# Patient Record
Sex: Female | Born: 1965 | Hispanic: No | Marital: Married | Smoking: Never smoker
Health system: Southern US, Community
[De-identification: ages and names within clinical notes are randomized; demographics above are authoritative.]

## PROBLEM LIST (undated history)

## (undated) DIAGNOSIS — I10 Essential (primary) hypertension: Secondary | ICD-10-CM

## (undated) DIAGNOSIS — E7800 Pure hypercholesterolemia, unspecified: Secondary | ICD-10-CM

## (undated) DIAGNOSIS — E119 Type 2 diabetes mellitus without complications: Secondary | ICD-10-CM

## (undated) DIAGNOSIS — D649 Anemia, unspecified: Secondary | ICD-10-CM

## (undated) HISTORY — DX: Anemia, unspecified: D64.9

---

## 2000-12-21 ENCOUNTER — Other Ambulatory Visit: Admission: RE | Admit: 2000-12-21 | Discharge: 2000-12-21 | Payer: Self-pay | Admitting: Obstetrics and Gynecology

## 2001-01-23 ENCOUNTER — Ambulatory Visit (HOSPITAL_COMMUNITY): Admission: RE | Admit: 2001-01-23 | Discharge: 2001-01-23 | Payer: Self-pay | Admitting: Obstetrics and Gynecology

## 2001-01-23 ENCOUNTER — Encounter: Payer: Self-pay | Admitting: Obstetrics and Gynecology

## 2001-04-01 ENCOUNTER — Inpatient Hospital Stay (HOSPITAL_COMMUNITY): Admission: AD | Admit: 2001-04-01 | Discharge: 2001-04-01 | Payer: Self-pay | Admitting: *Deleted

## 2001-04-01 ENCOUNTER — Encounter: Payer: Self-pay | Admitting: Obstetrics and Gynecology

## 2001-04-16 ENCOUNTER — Encounter (INDEPENDENT_AMBULATORY_CARE_PROVIDER_SITE_OTHER): Payer: Self-pay | Admitting: Specialist

## 2001-04-16 ENCOUNTER — Inpatient Hospital Stay (HOSPITAL_COMMUNITY): Admission: AD | Admit: 2001-04-16 | Discharge: 2001-04-20 | Payer: Self-pay | Admitting: Obstetrics and Gynecology

## 2004-06-23 ENCOUNTER — Emergency Department (HOSPITAL_COMMUNITY): Admission: EM | Admit: 2004-06-23 | Discharge: 2004-06-23 | Payer: Self-pay | Admitting: Emergency Medicine

## 2005-01-25 ENCOUNTER — Ambulatory Visit: Payer: Self-pay | Admitting: Family Medicine

## 2005-07-01 ENCOUNTER — Ambulatory Visit: Payer: Self-pay | Admitting: Family Medicine

## 2005-08-03 ENCOUNTER — Ambulatory Visit: Payer: Self-pay | Admitting: Family Medicine

## 2005-12-14 ENCOUNTER — Emergency Department (HOSPITAL_COMMUNITY): Admission: EM | Admit: 2005-12-14 | Discharge: 2005-12-14 | Payer: Self-pay | Admitting: Emergency Medicine

## 2006-05-24 ENCOUNTER — Other Ambulatory Visit: Admission: RE | Admit: 2006-05-24 | Discharge: 2006-05-24 | Payer: Self-pay | Admitting: Family Medicine

## 2007-02-14 ENCOUNTER — Inpatient Hospital Stay (HOSPITAL_COMMUNITY): Admission: AD | Admit: 2007-02-14 | Discharge: 2007-02-22 | Payer: Self-pay | Admitting: Obstetrics and Gynecology

## 2007-02-28 ENCOUNTER — Inpatient Hospital Stay (HOSPITAL_COMMUNITY): Admission: AD | Admit: 2007-02-28 | Discharge: 2007-04-11 | Payer: Self-pay | Admitting: Obstetrics and Gynecology

## 2007-04-05 ENCOUNTER — Encounter: Payer: Self-pay | Admitting: Obstetrics and Gynecology

## 2007-04-08 ENCOUNTER — Encounter (INDEPENDENT_AMBULATORY_CARE_PROVIDER_SITE_OTHER): Payer: Self-pay | Admitting: Obstetrics and Gynecology

## 2007-04-13 ENCOUNTER — Inpatient Hospital Stay (HOSPITAL_COMMUNITY): Admission: AD | Admit: 2007-04-13 | Discharge: 2007-04-15 | Payer: Self-pay | Admitting: Obstetrics and Gynecology

## 2007-09-12 ENCOUNTER — Emergency Department (HOSPITAL_COMMUNITY): Admission: EM | Admit: 2007-09-12 | Discharge: 2007-09-12 | Payer: Self-pay | Admitting: Emergency Medicine

## 2007-10-15 ENCOUNTER — Emergency Department (HOSPITAL_COMMUNITY): Admission: EM | Admit: 2007-10-15 | Discharge: 2007-10-15 | Payer: Self-pay | Admitting: Emergency Medicine

## 2007-12-26 ENCOUNTER — Emergency Department (HOSPITAL_COMMUNITY): Admission: EM | Admit: 2007-12-26 | Discharge: 2007-12-26 | Payer: Self-pay | Admitting: Emergency Medicine

## 2008-08-05 ENCOUNTER — Other Ambulatory Visit: Admission: RE | Admit: 2008-08-05 | Discharge: 2008-08-05 | Payer: Self-pay | Admitting: Family Medicine

## 2009-12-04 ENCOUNTER — Emergency Department (HOSPITAL_COMMUNITY)
Admission: EM | Admit: 2009-12-04 | Discharge: 2009-12-04 | Payer: Self-pay | Source: Home / Self Care | Admitting: Family Medicine

## 2010-04-29 ENCOUNTER — Other Ambulatory Visit (HOSPITAL_COMMUNITY): Payer: Self-pay | Admitting: Family Medicine

## 2010-04-29 DIAGNOSIS — Z1231 Encounter for screening mammogram for malignant neoplasm of breast: Secondary | ICD-10-CM

## 2010-05-07 ENCOUNTER — Ambulatory Visit (HOSPITAL_COMMUNITY)
Admission: RE | Admit: 2010-05-07 | Discharge: 2010-05-07 | Disposition: A | Discharge status: Home / Self Care | Payer: BC Managed Care – PPO | Source: Ambulatory Visit | Attending: Family Medicine | Admitting: Family Medicine

## 2010-05-07 DIAGNOSIS — Z1231 Encounter for screening mammogram for malignant neoplasm of breast: Secondary | ICD-10-CM | POA: Insufficient documentation

## 2010-05-19 NOTE — Op Note (Signed)
NAME:  [name] [name], [name] [name]                    ACCOUNT NO.:  [id number]   MEDICAL RECORD NO.:  [id number]          PATIENT TYPE:  INP   LOCATION:                                [facility]   PHYSICIAN:  Dineen Kid. Rana Snare, M.D.    DATE OF BIRTH:  1965-11-16   DATE OF PROCEDURE:  04/08/2007  DATE OF DISCHARGE:                               OPERATIVE REPORT   PREOPERATIVE DIAGNOSIS:  Intrauterine pregnancy at 11 and 4/7 weeks with  twins, now with labor, previous cesarean section with desired repeat,  multiparity and desires sterility.   POSTOPERATIVE DIAGNOSIS:  Intrauterine pregnancy at 31 and 4/7 weeks  with twins, now with labor, previous cesarean section with desired  repeat, multiparity and desires sterility plus extensive pelvic  adhesions.   SURGEON:  Dineen Kid. Rana Snare, M.D.   ANESTHESIA:  Spinal.   PROCEDURE:  Repeat low segment transverse cesarean section, [facility]  bilateral tubal ligation, lysis of adhesions and then left salpingo-  oophorectomy.   INDICATIONS:  Ms. [name] [name] is a 45 year old G5, P4, who has been admitted to  the hospital for over a month with preterm labor with advanced cervical  dilation over the last week and previous cesarean section desiring  repeat cesarean section, multiparity.  This afternoon she began laboring  despite tocolysis with burst terbutaline and was contracting every 3-5  minutes with cervix 5 cm dilated and 100% effaced.  We proceeded with  repeat cesarean section.  She did have multiparity and desires sterility  and planned tubal ligation.  Risks and benefits of the procedure were  discussed at length which included but not limited to risk of infection,  bleeding, damage to the uterus, tubes, ovaries, bowel or bladder,  fetus/fetuses, tubal failure rate at 05/998.  She gives her informed  consent and wished to proceed.   FINDINGS AT SURGERY:  Viable female infant, Apgars 8 and 8, pH arterial  7.36; viable female infant, Apgars 8 and 9, pH 3.6.  Baby A  was vertex.  Baby B was breech.  Extensive adhesions from the anterior abdominal wall  to the uterus from the fundus down to the lower segment and also at the  left adnexa.   DESCRIPTION OF PROCEDURE:  After adequate analgesia the patient was  placed in the supine position with left lateral tilt.  She was sterilely  prepped and draped.  The bladder was sterilely drained with Foley  catheter.  A Pfannenstiel skin incision was made two fingerbreadths  above the pubic symphysis and taken down sharply to the fascia which was  incised transversely and extended superiorly and inferiorly.  The  bellies of the rectus muscles were separated sharply midline.  Peritoneum was entered sharply.  The adhesions were carefully dissected  off the anterior surface and bladder flap was created and placed behind  the bladder blade.  Low segment myotomy incision was made down the  infant's vertex and vertex of A was delivered atraumatically and nares  and pharynx suctioned.  Baby boy was delivered and handed to the  pediatricians for resuscitation.  Cord blood was obtained.  Amniotomy  was performed on baby B.  The feet were grasped and double footling  breech was delivered viable female.  The nares and pharynx were  suctioned.  Cord clamped, cut and handed to pediatrician for  resuscitation.  Cord blood was obtained.  Placenta extracted manually.  The uterus was wiped clean with a dry lap.  The myotomy incision was  closed in two layers, first being a running locking and second being an  imbricating layer of zero Monocryl suture.  Unable to identify the  fallopian tubes due to the extensive scar tissue.  Meticulous dissection  of the uterus from the anterior abdominal wall was carried out.  The  uterus was then exteriorized and scar tissue was also dissected from the  left adnexal area.  Right fallopian tube was identified by the  fimbriated end.  Midportion of fallopian tube was grasped.  Two sutures  of  zero plain were passed to the mesosalpinx which was doubly ligated,  central portion excised and 2-0 silk was used to ligate the proximal  portion.  Left fallopian tube was identified in a similar manner but  because of extensive adhesions an attempt at tubal ligation was  unsuccessful and because the adhesions and now bleeding from the area  from lysis of adhesions at the infundibulopelvic ligament a Kelly clamp  was placed across the IFP and the mesosalpinx with good control of  hemostasis.  The sutures of zero Monocryl were placed across the  infundibulopelvic ligament and also across the mesosalpinx.  The left  distal fallopian tube and also left ovary were removed.  The left ovary  again was removed because of, to achieve hemostasis where lysis of  adhesions and bleeding occurred along.  The uterus was then placed back  into the peritoneal cavity and after a copious amount of irrigation  adequate hemostasis was assured.  The peritoneum was closed with zero  Monocryl.  Rectus muscle plicated in midline.  Irrigation was applied  after adequate hemostasis.  The fascia was closed with zero PDS in  running fashion.  Irrigation applied after adequate hemostasis.  Skin  staples and Steri-Strips applied.  The patient tolerated the procedure  well and was stable on transfer to the recovery room.  Sponge,  instrument count was normal x3.  Estimated blood loss 1000 mL.  I did  give Methergine 0.2 mg intramuscularly at the time of the delivery due  to uterine atony.  She also had some atony after the delivery and  required Methergine.      Dineen Kid Rana Snare, M.D.  Electronically Signed     DCL/MEDQ  D:  04/08/2007  T:  04/08/2007  Job:  [id number]

## 2010-05-19 NOTE — H&P (Signed)
NAME:  [name] [name], [name] [name]                    ACCOUNT NO.:  [id number]   MEDICAL RECORD NO.:  [id number]          PATIENT TYPE:  INP   LOCATION:  9151                          [facility]ILITY:  [facility]   PHYSICIAN:  Dineen Kid. Rana Snare, M.D.    DATE OF BIRTH:  06-Nov-1965   DATE OF ADMISSION:  02/28/2007  DATE OF DISCHARGE:                              HISTORY & PHYSICAL   HISTORY OF PRESENT ILLNESS:  Ms. Kenzleigh is a 45 year old, G5, P4 with twin  pregnancy, history of preterm labor, and advanced cervical effacement  who was hospitalized until last week due to this and was discharged  home.  She presented today to the office today for routine follow-up.  Ultrasound evaluation shows Baby A in the vertex position, 78th  percentile for fetal weight, with normal fluid and baby B in transverse  presentation 75th percentile for weight with normal fluid.  However,  cervical length of 6.9 mm with funneling.  Her previous ultrasound was 9  mm with funneling.  The patient denied any contractions and had been on  bed rest.  However, no tocolytics since her discharge home last week.  On placing her on fetal monitoring, she was contracting every 3-5  minutes, was given 2.5 mg orally with minimal response and so was  admitted to the hospital for magnesium sulfate tocolysis due to preterm  labor.  Her estimated date of confinement is May 23, 2007.   Her past medical history is significant for previous cesarean section,  last pregnancy at 24 weeks, three vaginal deliveries.   Her medications are prenatal vitamins.   She has no known drug allergies.   PHYSICAL EXAMINATION:  Blood pressure is 120/72.  HEART:  Regular rate and rhythm.  LUNGS:  Clear to auscultation bilaterally.  ABDOMEN:  Is gravid, nontender.  Cervical length by ultrasound is 6.9 mm with funneling.   IMPRESSION AND PLAN:  Twin pregnancy at [redacted] weeks gestational age, vertex  transverse presentation with planned cesarean section for delivery with  cervical  change on ultrasound and also contractions.  Plan to readmit  for magnesium sulfate tocolysis.  Patient is status post betamethasone.      Dineen Kid Rana Snare, M.D.  Electronically Signed     DCL/MEDQ  D:  03/01/2007  T:  03/01/2007  Job:  [id number]

## 2010-05-19 NOTE — H&P (Signed)
NAME:  [name], [name]                    ACCOUNT NO.:  [id number]   MEDICAL RECORD NO.:  [id number]          PATIENT TYPE:  MAT   LOCATION:  DFTL                          [facility]   PHYSICIAN:  Duke Salvia. Marcelle Overlie, M.D.DATE OF BIRTH:  [birth year]   DATE OF ADMISSION:  DATE OF DISCHARGE:                              HISTORY & PHYSICAL   CHIEF COMPLAINT:  Increased contractions.   HISTORY OF PRESENT ILLNESS:  A 45 year old, G5, P3-1-0-4, EDD May 19,  twin gestation 25-6/7 weeks. Of significance, this patient had a  cesarean section for preterm labor with bleeding in 2003 delivering a 3  pound 8 ounce female at 24 weeks. She is pregnant with twins, presented  for routine ultrasound today for growth and the cervical length was  noted to be 9 mm with funneling. On questioning, she is complaining of  increased pelvic pressure.   PHYSICAL EXAMINATION:  There is bulging of the lower uterine segment.  She is dilated about a fingertip. She is admitted now for betamethasone,  tocolytics and further observation.   She is O+.   OBSTETRICAL HISTORY:  Three vaginal deliveries at term, one cesarean  section as noted above.   ALLERGIES:  None.   For family and social history, please see the [facility] form.   PHYSICAL EXAMINATION:  VITAL SIGNS:  Temperature 98.2, blood pressure  124/80.  HEENT:  Unremarkable.  NECK:  Supple without masses.  LUNGS:  Clear.  CARDIOVASCULAR:  Regular rate and rhythm without murmurs, rubs or  gallops.  BREASTS:  Not examined.  30 cm fundal height. Fetal heart rate was 140 A and B. Cervix was  fingertip, thin, bulging lower uterine segment, membranes intact.  EXTREMITIES:  Unremarkable.  NEUROLOGIC:  Unremarkable.   IMPRESSION:  1. 25-6/7 week twin gestation.  2. Preterm labor.  3. History of prior cesarean section for preterm delivery.   PLAN:  Will admit for tocolytics, bedrest, betamethasone.      Richard M. Marcelle Overlie, M.D.  Electronically  Signed     RMH/MEDQ  D:  02/14/2007  T:  02/14/2007  Job:  [id number]

## 2010-05-19 NOTE — Discharge Summary (Signed)
NAME:  [name], [name]                    ACCOUNT NO.:  [id number]   MEDICAL RECORD NO.:  [id number]          PATIENT TYPE:  INP   LOCATION:  9309                          [facility]   PHYSICIAN:  Juluis Mire, M.D.   DATE OF BIRTH:  Aug 08, 1965   DATE OF ADMISSION:  04/13/2007  DATE OF DISCHARGE:  04/15/2007                               DISCHARGE SUMMARY   ADMISSION DIAGNOSIS:  Postpartum endometritis.   DISCHARGE DIAGNOSIS:  Postpartum endometritis.   For complete history and physical, please see dictated note.   HOSPITAL COURSE:  The patient was brought and begun on antibiotics in  the form of gentamicin, ampicillin, and Cleocin.  She had immediate  defervescence of her temperature.  She remained completely afebrile  throughout Friday, 10th and until Saturday, 11th.  After discussion,  decision was to send the patient home at that time.  At the time of  discharge, she was afebrile.  Abdomen was soft.  She had minimal uterine  tenderness at this point.  Incision was clear.  Bowel sounds were  active.  She was tolerating her diet and voiding without difficulty.  It  is noted that her white count initially was 15,100.  She continues to  have anemia of hemoglobin 6.9, is on iron at home.   In terms of complication, I count stay in hospital chart.  The patient  is discharged home in stable condition.   DISPOSITION:  She is discharged home on Augmentin.  She has pain  medicine and iron at home.  She was instructed to call with increasing  fever, nausea, vomiting, or active vaginal bleeding.  She will follow up  in the office early next week.      Juluis Mire, M.D.  Electronically Signed     JSM/MEDQ  D:  04/15/2007  T:  04/15/2007  Job:  [id number]   cc:   Zelphia Cairo, MD  Fax: [phone]

## 2010-05-22 NOTE — Op Note (Signed)
[facility]  Patient:    [name] [name], [name] [name] Visit Number: [id number] MRN: [id number]          Service Type: OBS Location: [facility] Attending Physician:  Malon Kindle Dictated by:   Zenaida Niece, M.D. Proc. Date: 04/16/01 Admit Date:  04/01/2001 Discharge Date: 04/01/2001                             Operative Report  PREOPERATIVE DIAGNOSES:       1. Intrauterine pregnancy at 30 weeks.                               2. Advanced preterm labor.                               3. Breech presentation.  POSTOPERATIVE DIAGNOSES:      1. Intrauterine pregnancy at 30 weeks.                               2. Advanced preterm labor.                               3. Breech presentation.  OPERATION:                    Primary low transverse cesarean section without                               extensions.  SURGEON:                      Zenaida Niece, M.D.  ANESTHESIA:                   Spinal.  ESTIMATED BLOOD LOSS:         800 cc.  FINDINGS:                     The patient had a normal female anatomy and delivered a viable female infant with Apgars of 9 and 9 that weighed 1898 g and was taken to the NICU.  DESCRIPTION OF PROCEDURE:     The patient was taken to the operating room and placed in the dorsal supine position and then rolled to her side.  Dr. Pamalee Leyden instilled spinal anesthesia and she was placed in the dorsal supine position with a left lateral tilt.  Her abdomen was prepped and draped in the usual sterile fashion and a Foley catheter inserted.  The level of her anesthesia was found to be adequate and her abdomen was entered via standard Pfannenstiel incision.  The bladder flap was created sharply and a 4 cm transverse incision made from the lower uterine segment.  The incision was extended bilaterally and digitally and with bandaged scissors.  Membranes were ruptured with an Allis clamp and revealed clear fluid.  The baby was found to be  in a frank breech presentation.  The baby delivered atraumatically.  The mouth and nares were suctioned, the cord was doubly clamped and cut, and then the infant handed to the waiting pediatric team.  Cord blood and segment of cord for gas were obtained  and the placenta delivered spontaneously.  The uterus was wiped with a clean lap pad and found to be normal.  The uterine incision was inspected and found to be free of any extensions.  It was then closed in one layer, being a running locking layer with #1 chromic with adequate hemostasis. Bleeding from the serosal edges was controlled with electrocautery.  Both tubes and ovaries were inspected and found to be normal.  The subfascial space was irrigated and made hemostatic with electrocautery.  The deep fascia was closed in a running fashion starting at both ends and meeting in the middle with 0 Vicryl.  The subcutaneous tissue was irrigated and made hemostatic with electrocautery and the skin was closed with staples and a sterile dressing.  The patient tolerated the procedure well and was taken to the recovery room in stable condition. Dictated by:   Zenaida Niece, M.D. Attending Physician:  Malon Kindle DD:  04/16/01 TD:  04/17/01 Job: [id number]

## 2010-05-22 NOTE — Discharge Summary (Signed)
[facility]  Patient:    [name] [name], [name] [name] Visit Number: [id number] MRN: [id number]          Service Type: OBS Location: 9300 [id number] Attending Physician:  Michaele Offer Dictated by:   Zenaida Niece, M.D. Admit Date:  04/16/2001 Discharge Date: 04/20/2001                             Discharge Summary  ADMISSION DIAGNOSES:          1. Intrauterine pregnancy at 30 weeks.                               2. Preterm labor.                               3. Breech presentation.  DISCHARGE DIAGNOSES:          1. Intrauterine pregnancy at 30 weeks.                               2. Preterm labor.                               3. Breech presentation.  PROCEDURE:                    Primary low transverse cesarean section.  HISTORY OF PRESENT ILLNESS:   This is a 45 year old [place] female, gravida 4, para 3-0-0-3, with an EGA of [redacted] weeks by an 11-week ultrasound with a due date of June 20, who presents with complaint of regular contractions, bloody show, good fetal movement, and no rupture of membranes.  Evaluation in maternity admissions unit revealed her cervix to be 4 cm dilated with regular contractions and she was admitted to attempt tocolysis.  Prenatal care complicated by advanced maternal age for which she declined an amniocentesis and had a normal triple screen and normal ultrasound.  She had a second trimester UTI with coagulase negative Staph treated with Macrobid with a negative test of cure.  Cervix was long and closed at 24 weeks, recent size greater than dates with her last visit on March 20.  PRENATAL LABORATORY DATA:     Blood type is O positive with a negative antibody screen, RPR nonreactive, rubella immune, hepatitis B surface antigen negative, HIV negative, Gonorrhea and Chlamydia negative, triple screen normal.  PAST OBSTETRIC HISTORY:       1989 vaginal delivery at 40 weeks, 9 pounds. 1990 vaginal delivery at 40 weeks, 8  pounds.  1996 vaginal delivery at 40 weeks, 6 pounds.  These were all fast labors.  PHYSICAL EXAMINATION:         VITAL SIGNS: She was afebrile with stable vital signs.  Fetal heart rate tracing reactive with contractions every two to three minutes.  ABDOMEN: Benign, gravid, and nontender.  PELVIC: On my first examination she was 5, 70, -3 with a questionable presentation and she changed to 6, 70, -2 and she was breech by ultrasound.  HOSPITAL COURSE:              The patient was admitted and had magnesium started in an attempt at tocolysis.  However, she continued to progress and breech presentation was  confirmed.  The situation was discussed with the patient and her husband and we agreed to proceed with cesarean section.  On the morning of April 13, she underwent a primary low transverse cesarean section under spinal anesthesia.  Estimated blood loss was 800 cc.  The patient had normal anatomy and delivered a viable female infant with Apgars of 9 and 9 that weighed 1898 grams.  Postoperatively, she did very well, was rapidly able to ambulate, and tolerate a regular diet.  She remained afebrile. Predelivery hemoglobin 13.1, postdelivery was 10.2.  On the morning of postoperative day #4, she was felt to be stable enough for discharge home. Her baby was stable in the NICU.  Incision was healing well, staples were removed, and Steri-Strips applied.  DISCHARGE INSTRUCTIONS:       Diet is a regular diet.  Activity is pelvic rest. No strenuous activity and no driving.  FOLLOW-UP:                    In two weeks.  DISCHARGE MEDICATIONS:        Percocet p.r.n. pain.  She is given our discharge pamphlet. Dictated by:   Zenaida Niece, M.D. Attending Physician:  Michaele Offer DD:  04/20/01 TD:  04/20/01 Job: [id number]

## 2010-05-22 NOTE — Discharge Summary (Signed)
NAME:  [name] [name], [name] [name]                    ACCOUNT NO.:  [id number]   MEDICAL RECORD NO.:  [id number]          PATIENT TYPE:  INP   LOCATION:  9151                          [facility]ILITY:  [facility]   PHYSICIAN:  Duke Salvia. Marcelle Overlie, M.D.DATE OF BIRTH:  October 27, 1965   DATE OF ADMISSION:  02/14/2007  DATE OF DISCHARGE:  02/22/2007                               DISCHARGE SUMMARY   ADMITTING DIAGNOSES:  1. Intrauterine pregnancy at 25-6/7th weeks' estimated gestational      age.  2. Twin gestation.  3. Preterm labor.   DISCHARGE DIAGNOSES:  1. Intrauterine pregnancy at 44 weeks estimated gestational age.  2. Twin gestation.  3. Preterm labor arrested.   REASON FOR ADMISSION:  Please see dictated H&P.   HOSPITAL COURSE:  The patient is a 45 year old [place] gravida 5, para  3-1-0-4, who was admitted 25-6/7th weeks with twin gestation after  routine ultrasound revealed cervical length of 9 mm with funneling.  The  patient's history was significant for preterm delivery by cesarean in  2003 at 24 weeks.  The patient was now admitted for betamethasone  tocolytics in further observation.  Fetal heart tones were reactive.  Fundal height was noted to be 30 cm.  Cervical length was fingertip  dilated with bulge in lower uterine segment.  Membranes were intact.  Twins were noted to be in the vertex breech presentation.  The patient  was started on magnesium sulfate and steroids were given.  The patient  was also receiving progesterone weekly injections.  Routine Glucola  screening was noted to be elevated.  3-hour glucose tolerance test was  performed which revealed one abnormal value.  Repeat ultrasound revealed  cervical length of 1 cm with increase in amniotic fluid volume and  concordant growth.  Consult was made with maternal fetal medicine.  The  patient did desire discharge and due to no further contractions and  cervical length being stable, arrangements were made.   CONDITION ON DISCHARGE:   Stable.   DIET:  Modified carbohydrate, gestational diabetes diet.   ACTIVITY:  Bed rest with bathroom privileges.  The patient was  instructed to call for increasing distal cramping, uterine contractions,  or low backache.  The patient was also encouraged to call for vaginal  bleeding, gush of fluid, or increasing pressure.   DISCHARGE MEDICATIONS:  Prenatal vitamins and weekly progesterone  injections.      Julio Sicks, N.P.      Richard M. Marcelle Overlie, M.D.  Electronically Signed    CC/MEDQ  D:  05/21/2007  T:  05/22/2007  Job:  [id number]

## 2010-05-22 NOTE — Discharge Summary (Signed)
NAME:  [name] [name], [name] [name]                    ACCOUNT NO.:  [id number]   MEDICAL RECORD NO.:  [id number]           PATIENT TYPE:   LOCATION:                                 FACILITY:   PHYSICIAN:  Guy Sandifer. Henderson Cloud, M.D. DATE OF BIRTH:  03/18/1965   DATE OF ADMISSION:  04/08/2007  DATE OF DISCHARGE:  04/11/2007                               DISCHARGE SUMMARY   ADMITTING DIAGNOSES:  1. Intrauterine pregnancy at 33-4/7th weeks' estimated gestational      age.  2. Twin gestation.  3. Spontaneous onset of labor.  4. Previous cesarean sections.  5. Multiparity, desires permanent sterilization.   DISCHARGE DIAGNOSES:  1. Status post low-transverse cesarean section.  2. Viable female and female infants.  3. Permanent sterilization.   PROCEDURE:  1. Repeat low-transverse cesarean section.  2. [place] bilateral tubal ligation.  3. Lysis of adhesions.  4. Left salpingo-oophorectomy.   REASON FOR ADMISSION:  Please see dictated H and P.   HOSPITAL COURSE:  The patient is a 45 year old [place] gravida 5, para  4 that was admitted to [facility] with spontaneous onset  of labor.  The patient had been hospitalized intermittently over the  last month for preterm labor and advanced cervical dilatation.  The  patient had had a previous cesarean section and desired repeat.  On the  day of admission, the patient began laboring despite tocolysis with  terbutaline and was noted to be contracting every 3-5 minutes with  cervix now dilated to 5 cm and 100% effaced.  The patient was now taken  to the operating room where spinal anesthesia was administered without  difficulty.  A low-transverse incision was made with delivery of a  viable twin A baby female with Apgars of 8 at one minute and 8 at five  minute, arterial cord pH of 7.36; baby B was female infant with Apgars  of 8 at one minute and 9 at five minutes, and arterial cord pH of 7.36.  Extensive lysis of adhesions on the anterior  abdominal wall was noted  and was removed.  The patient tolerated the procedure well and was taken  to the recovery room in stable condition.  On postoperative day #1, the  patient was without complaint.  Vital signs were stable.  Abdomen soft.  Abdominal dressing was noted to be clean, dry, and intact.  Laboratory  findings revealed hemoglobin of 7.5.  Both babies were doing well on the  NICU.  On postoperative day #2, the patient was without complaint.  Vital signs were stable.  Abdomen soft.  Fundus firm and nontender.  Abdominal dressing was noted to be clean, dry and intact.  Laboratory  findings revealed hemoglobin of 6.3 and WBC count of 19.8.  On  postoperative day #3, the patient denied dizziness or short of breath.  Babies were stable in the NICU.  Vital signs were stable.  She was  afebrile.  Heart rate was 85-100.  Abdomen soft.  Fundus firm and  nontender.  Incision was clean, dry, and intact.  Staples were removed.  Laboratory  findings revealed hemoglobin 6.2 and WBC count of 19.3.   DISCHARGE INSTRUCTIONS:  Reviewed and the patient was later discharged  home.   CONDITION ON DISCHARGE:  Stable.   DIET:  Regular as tolerated.   ACTIVITY:  No heavy lifting, no driving x2 weeks, and no vaginal entry.   FOLLOWUP:  The patient to follow up in the office in 1 week for an  incision check and hemoglobin check.  She is to call for temperature  greater than 100 degrees, persistent nausea, vomiting, heavy vaginal  bleeding, and/or redness or drainage at the incisional site.   DISCHARGE MEDICATIONS:  1. Percocet 5/325 mg, #30 one p.o. for 4-6 hours p.r.n.  2. Motrin 600 mg every 6 hours.  3. Tandem F one p.o. daily.  4. Prenatal vitamins one p.o. daily.      Julio Sicks, N.P.      Guy Sandifer. Henderson Cloud, M.D.  Electronically Signed    CC/MEDQ  D:  05/21/2007  T:  05/22/2007  Job:  [id number]

## 2010-06-02 ENCOUNTER — Other Ambulatory Visit (HOSPITAL_COMMUNITY)
Admission: RE | Admit: 2010-06-02 | Discharge: 2010-06-02 | Disposition: A | Discharge status: Home / Self Care | Payer: BC Managed Care – PPO | Source: Ambulatory Visit | Attending: Family Medicine | Admitting: Family Medicine

## 2010-06-02 ENCOUNTER — Other Ambulatory Visit: Payer: Self-pay | Admitting: Family Medicine

## 2010-06-02 DIAGNOSIS — Z0000 Encounter for general adult medical examination without abnormal findings: Secondary | ICD-10-CM | POA: Insufficient documentation

## 2010-09-25 LAB — CBC
HCT: 34.5 — ABNORMAL LOW
Hemoglobin: 11.9 — ABNORMAL LOW
Hemoglobin: 11.7 — ABNORMAL LOW
MCHC: 34.5
MCHC: 34.3
RBC: 4.07
RDW: 14.2

## 2010-09-25 LAB — GLUCOSE, 2 HOUR GESTATIONAL: Glucose Tolerance, 2 hour: 197 — ABNORMAL HIGH

## 2010-09-28 LAB — CBC
HCT: 31 — ABNORMAL LOW
MCV: 82.8
Platelets: 333
RBC: 3.74 — ABNORMAL LOW
WBC: 11.6 — ABNORMAL HIGH

## 2010-09-29 LAB — CBC
HCT: 19.3 — ABNORMAL LOW
HCT: 19.8 — ABNORMAL LOW
HCT: 32.1 — ABNORMAL LOW
Hemoglobin: 6.2 — CL
Hemoglobin: 6.3 — CL
Hemoglobin: 6.6 — CL
Hemoglobin: 6.9 — CL
MCHC: 34.1
MCV: 81.7
MCV: 81.9
MCV: 83.7
Platelets: 231
Platelets: 291
Platelets: 405 — ABNORMAL HIGH
RBC: 2.18 — ABNORMAL LOW
RBC: 2.23 — ABNORMAL LOW
RBC: 2.35 — ABNORMAL LOW
RDW: 17.3 — ABNORMAL HIGH
RDW: 17.4 — ABNORMAL HIGH
RDW: 18.2 — ABNORMAL HIGH
RDW: 18.7 — ABNORMAL HIGH
WBC: 15.1 — ABNORMAL HIGH
WBC: 19.8 — ABNORMAL HIGH

## 2010-09-29 LAB — URINALYSIS, ROUTINE W REFLEX MICROSCOPIC
Bilirubin Urine: NEGATIVE
Ketones, ur: 15 — AB
Nitrite: NEGATIVE
pH: 7

## 2010-09-29 LAB — URINE MICROSCOPIC-ADD ON

## 2010-09-29 LAB — DIFFERENTIAL
Lymphocytes Relative: 10 — ABNORMAL LOW
Myelocytes: 0
Neutrophils Relative %: 85 — ABNORMAL HIGH
Promyelocytes Absolute: 0

## 2010-09-29 LAB — CREATININE, SERUM
Creatinine, Ser: 0.89
GFR calc non Af Amer: >60

## 2010-10-05 LAB — DIFFERENTIAL
Basophils Absolute: 0
Basophils Relative: 0
Eosinophils Relative: 1
Lymphocytes Relative: 10 — ABNORMAL LOW
Monocytes Absolute: 0.5
Monocytes Relative: 7
Neutro Abs: 6.3

## 2010-10-05 LAB — CBC
Platelets: 341
RDW: 19.2 — ABNORMAL HIGH
WBC: 7.8

## 2010-10-05 LAB — URINALYSIS, ROUTINE W REFLEX MICROSCOPIC
Nitrite: NEGATIVE
Specific Gravity, Urine: 1.022
Urobilinogen, UA: 0.2
pH: 8

## 2010-10-05 LAB — COMPREHENSIVE METABOLIC PANEL
AST: 18
Albumin: 3.3 — ABNORMAL LOW
Alkaline Phosphatase: 71
BUN: 9
Chloride: 105
GFR calc Af Amer: >60
Potassium: 3.6
Total Bilirubin: 0.5
Total Protein: 6.8

## 2010-10-05 LAB — OCCULT BLOOD X 1 CARD TO LAB, STOOL: Fecal Occult Bld: NEGATIVE

## 2010-10-05 LAB — PREGNANCY, URINE: Preg Test, Ur: NEGATIVE

## 2010-10-07 LAB — POCT RAPID STREP A: Streptococcus, Group A Screen (Direct): NEGATIVE

## 2010-10-09 LAB — DIFFERENTIAL
Basophils Absolute: 0.1 10*3/uL (ref 0.0–0.1)
Basophils Relative: 1 % (ref 0–1)
Eosinophils Absolute: 0.1 10*3/uL (ref 0.0–0.7)
Monocytes Relative: 4 % (ref 3–12)
Neutrophils Relative %: 78 % — ABNORMAL HIGH (ref 43–77)

## 2010-10-09 LAB — CBC
HCT: 39.1 % (ref 36.0–46.0)
Hemoglobin: 12.6 g/dL (ref 12.0–15.0)
RBC: 5.36 MIL/uL — ABNORMAL HIGH (ref 3.87–5.11)
WBC: 10.2 10*3/uL (ref 4.0–10.5)

## 2010-10-09 LAB — COMPREHENSIVE METABOLIC PANEL
ALT: 20 U/L (ref 0–35)
Alkaline Phosphatase: 72 U/L (ref 39–117)
CO2: 23 mEq/L (ref 19–32)
Chloride: 105 mEq/L (ref 96–112)
Glucose, Bld: 99 mg/dL (ref 70–99)
Potassium: 4.2 mEq/L (ref 3.5–5.1)
Sodium: 135 mEq/L (ref 135–145)
Total Bilirubin: 0.4 mg/dL (ref 0.3–1.2)
Total Protein: 7.5 g/dL (ref 6.0–8.3)

## 2010-10-09 LAB — POCT PREGNANCY, URINE: Preg Test, Ur: NEGATIVE

## 2010-10-09 LAB — LACTIC ACID, PLASMA: Lactic Acid, Venous: 1.5 mmol/L (ref 0.5–2.2)

## 2011-01-19 ENCOUNTER — Other Ambulatory Visit: Payer: Self-pay | Admitting: Family Medicine

## 2011-01-19 DIAGNOSIS — M25511 Pain in right shoulder: Secondary | ICD-10-CM

## 2011-01-22 ENCOUNTER — Inpatient Hospital Stay: Admission: RE | Admit: 2011-01-22 | Payer: BC Managed Care – PPO | Source: Ambulatory Visit

## 2011-01-23 ENCOUNTER — Ambulatory Visit
Admission: RE | Admit: 2011-01-23 | Discharge: 2011-01-23 | Disposition: A | Discharge status: Home / Self Care | Payer: BC Managed Care – PPO | Source: Ambulatory Visit | Attending: Family Medicine | Admitting: Family Medicine

## 2011-01-23 DIAGNOSIS — M25511 Pain in right shoulder: Secondary | ICD-10-CM

## 2011-09-09 ENCOUNTER — Emergency Department (HOSPITAL_COMMUNITY): Payer: BC Managed Care – PPO

## 2011-09-09 ENCOUNTER — Encounter (HOSPITAL_COMMUNITY): Payer: Self-pay | Admitting: Emergency Medicine

## 2011-09-09 ENCOUNTER — Emergency Department (HOSPITAL_COMMUNITY)
Admission: EM | Admit: 2011-09-09 | Discharge: 2011-09-09 | Disposition: A | Discharge status: Home / Self Care | Payer: BC Managed Care – PPO | Attending: Emergency Medicine | Admitting: Emergency Medicine

## 2011-09-09 DIAGNOSIS — N200 Calculus of kidney: Secondary | ICD-10-CM | POA: Insufficient documentation

## 2011-09-09 DIAGNOSIS — R109 Unspecified abdominal pain: Secondary | ICD-10-CM | POA: Insufficient documentation

## 2011-09-09 DIAGNOSIS — R110 Nausea: Secondary | ICD-10-CM | POA: Insufficient documentation

## 2011-09-09 DIAGNOSIS — R300 Dysuria: Secondary | ICD-10-CM | POA: Insufficient documentation

## 2011-09-09 LAB — BASIC METABOLIC PANEL
BUN: 17 mg/dL (ref 6–23)
Creatinine, Ser: 1.14 mg/dL — ABNORMAL HIGH (ref 0.50–1.10)
GFR calc Af Amer: 66 mL/min — ABNORMAL LOW (ref >90)
GFR calc non Af Amer: 57 mL/min — ABNORMAL LOW (ref >90)

## 2011-09-09 LAB — URINALYSIS, ROUTINE W REFLEX MICROSCOPIC
Bilirubin Urine: NEGATIVE
Glucose, UA: NEGATIVE mg/dL
Ketones, ur: NEGATIVE mg/dL
Nitrite: NEGATIVE
pH: 5.5 (ref 5.0–8.0)

## 2011-09-09 LAB — CBC WITH DIFFERENTIAL/PLATELET
Basophils Relative: 1 % (ref 0–1)
Eosinophils Absolute: 0.3 10*3/uL (ref 0.0–0.7)
HCT: 37.4 % (ref 36.0–46.0)
Hemoglobin: 12.4 g/dL (ref 12.0–15.0)
MCH: 25.9 pg — ABNORMAL LOW (ref 26.0–34.0)
MCHC: 33.2 g/dL (ref 30.0–36.0)
Monocytes Absolute: 0.6 10*3/uL (ref 0.1–1.0)
Monocytes Relative: 7 % (ref 3–12)

## 2011-09-09 MED ORDER — ONDANSETRON HCL 4 MG/2ML IJ SOLN
4.0000 mg | INTRAMUSCULAR | Status: DC | PRN
  Administered 2011-09-09: 4 mg via INTRAVENOUS
  Filled 2011-09-09: qty 2

## 2011-09-09 MED ORDER — OXYCODONE-ACETAMINOPHEN 5-325 MG PO TABS: 1.0000 | ORAL_TABLET | Freq: Four times a day (QID) | ORAL | Status: AC | PRN

## 2011-09-09 MED ORDER — MORPHINE SULFATE 4 MG/ML IJ SOLN
4.0000 mg | Freq: Once | INTRAMUSCULAR | Status: AC
  Administered 2011-09-09: 4 mg via INTRAVENOUS
  Filled 2011-09-09: qty 1

## 2011-09-09 MED ORDER — KETOROLAC TROMETHAMINE 60 MG/2ML IM SOLN: 60.0000 mg | Freq: Once | INTRAMUSCULAR | Status: DC

## 2011-09-09 MED ORDER — OXYCODONE-ACETAMINOPHEN 5-325 MG PO TABS: 2.0000 | ORAL_TABLET | Freq: Once | ORAL | Status: DC

## 2011-09-09 MED ORDER — KETOROLAC TROMETHAMINE 30 MG/ML IJ SOLN
30.0000 mg | Freq: Once | INTRAMUSCULAR | Status: AC
  Administered 2011-09-09: 30 mg via INTRAVENOUS
  Filled 2011-09-09: qty 1

## 2011-09-09 NOTE — ED Notes (Signed)
Patient transported to CT. Will start PIV and administer medications upon pt's return

## 2011-09-09 NOTE — ED Provider Notes (Signed)
History/physical exam/procedure(s) were performed by non-physician practitioner and as supervising physician I was immediately available for consultation/collaboration. I have reviewed all notes and am in agreement with care and plan.   Yahsir Wickens S Lichelle Viets, MD 09/09/11 0659 

## 2011-09-09 NOTE — ED Provider Notes (Signed)
History     CSN: [id number]  Arrival date & time 09/09/11  0045   First MD Initiated Contact with Patient 09/09/11 [phone]      Chief Complaint  Patient presents with  . Flank Pain    (Consider location/radiation/quality/duration/timing/severity/associated sxs/prior treatment) Patient is a 46 y.o. female presenting with flank pain. The history is provided by the patient and a relative. The history is limited by a language barrier. A language interpreter was used (Pts family members).  Flank Pain This is a new problem. The current episode started today. The problem occurs constantly. The problem has been gradually worsening. Pertinent negatives include no abdominal pain, anorexia, arthralgias, change in bowel habit, chest pain, chills, congestion, coughing, diaphoresis, fatigue, fever, headaches, joint swelling, myalgias, nausea, neck pain, numbness, rash, sore throat, swollen glands, urinary symptoms, vertigo, visual change, vomiting or weakness. Exacerbated by: movement, vibration, palpation. She has tried nothing for the symptoms.    History reviewed. No pertinent past medical history.  History reviewed. No pertinent past surgical history.  No family history on file.  History  Substance Use Topics  . Smoking status: Never Smoker   . Smokeless tobacco: Not on file  . Alcohol Use: No    OB History    Grav Para Term Preterm Abortions TAB SAB Ect Mult Living                  Review of Systems  Constitutional: Negative for fever, chills, diaphoresis and fatigue.  HENT: Negative for ear pain, congestion, sore throat, neck pain and sinus pressure.   Eyes: Negative for visual disturbance.  Respiratory: Negative for cough.   Cardiovascular: Negative for chest pain.  Gastrointestinal: Negative for nausea, vomiting, abdominal pain, anorexia and change in bowel habit.  Genitourinary: Positive for dysuria, urgency, flank pain and difficulty urinating.  Musculoskeletal: Negative for  myalgias, joint swelling and arthralgias.  Skin: Negative for color change and rash.  Neurological: Negative for dizziness, vertigo, weakness, numbness and headaches.  Psychiatric/Behavioral: Negative for confusion.  All other systems reviewed and are negative.    Allergies  Review of patient's allergies indicates no known allergies.  Home Medications  No current outpatient prescriptions on file.  BP 156/76  Pulse 106  Temp 97.9 F (36.6 C) (Oral)  Resp 24  SpO2 100%  LMP 09/05/2011  Physical Exam  Nursing note and vitals reviewed. Constitutional: She is oriented to person, place, and time. She appears well-developed and well-nourished. She appears distressed.  HENT:  Head: Normocephalic and atraumatic.  Eyes: Conjunctivae and EOM are normal.  Neck: Normal range of motion. Neck supple.  Cardiovascular: Normal rate, regular rhythm and normal heart sounds.   Pulmonary/Chest: Effort normal.  Abdominal: Soft. Normal appearance and bowel sounds are normal. She exhibits no distension, no ascites and no pulsatile midline mass. There is tenderness (CVA tenderness). There is CVA tenderness.  Musculoskeletal: Normal range of motion.  Neurological: She is alert and oriented to person, place, and time.  Skin: Skin is warm and dry. She is not diaphoretic.  Psychiatric: She has a normal mood and affect. Her behavior is normal.    ED Course  Procedures (including critical care time)  Labs Reviewed  URINALYSIS, ROUTINE W REFLEX MICROSCOPIC - Abnormal; Notable for the following:    APPearance CLOUDY (*)     Hgb urine dipstick LARGE (*)     Protein, ur 30 (*)     Leukocytes, UA TRACE (*)     All other components within normal  limits  CBC WITH DIFFERENTIAL - Abnormal; Notable for the following:    MCH 25.9 (*)     All other components within normal limits  BASIC METABOLIC PANEL - Abnormal; Notable for the following:    Glucose, Bld 115 (*)     Creatinine, Ser 1.14 (*)     GFR calc  non Af Amer 57 (*)     GFR calc Af Amer 66 (*)     All other components within normal limits  URINE MICROSCOPIC-ADD ON - Abnormal; Notable for the following:    Squamous Epithelial / LPF FEW (*)     Bacteria, UA MANY (*)     Crystals CA OXALATE CRYSTALS (*)     All other components within normal limits  POCT PREGNANCY, URINE   Ct Abdomen Pelvis Wo Contrast  09/09/2011  *RADIOLOGY REPORT*  Clinical Data: Left flank pain, nausea  CT ABDOMEN AND PELVIS WITHOUT CONTRAST  Technique:  Multidetector CT imaging of the abdomen and pelvis was performed following the standard protocol without intravenous contrast. Sagittal and coronal MPR images reconstructed from axial data set.  Comparison: 12/26/2007  Findings: Lung bases clear. Enlargement of left kidney with left hydronephrosis and perinephric edema. Left hydroureter due to a 3 mm diameter calculus at left ureterovesicle junction. No additional urinary tract calculi identified. Within limits of a nonenhanced exam no focal abnormalities of the liver, spleen, pancreas, or adrenal glands. Cortical scarring upper pole of the right kidney.  Normal appendix. Unremarkable uterus and adnexae. Bladder decompressed. A few small bowel loops in left upper quadrant are upper normal in size without definite evidence of obstruction. Stomach and bowel loops otherwise normal appearance. No mass, adenopathy, free fluid inflammatory process. No acute osseous findings.  IMPRESSION: Left hydronephrosis and hydroureter secondary to a 3 mm diameter left UVJ calculus.   Original Report Authenticated By: Lollie Marrow, M.D.      No diagnosis found.    MDM  Kidney stone  Pt has been diagnosed with a Kidney Stone via CT. There is no evidence of significant hydronephrosis, serum creatine WNL, vitals sign stable and the pt does not have irratractable vomiting. Pt will be dc home with pain medications & has been advised to follow up with PCP.          Jaci Carrel,  [place] 09/09/11 [phone]

## 2011-09-09 NOTE — ED Notes (Signed)
PT. REPORTS SUDDEN ONSET  LEFT FLANK / LEFT ABDOMINAL PAIN ONSET THIS EVENING WITH DYSURIA , DENIES NAUSEA , VOMITTING OR DIARRHEA. NO FEVER OR CHILLS.

## 2011-09-09 NOTE — ED Notes (Addendum)
Patient has returned from CT

## 2011-10-07 ENCOUNTER — Ambulatory Visit (INDEPENDENT_AMBULATORY_CARE_PROVIDER_SITE_OTHER): Payer: BC Managed Care – PPO | Admitting: Obstetrics and Gynecology

## 2011-10-07 ENCOUNTER — Encounter: Payer: Self-pay | Admitting: Obstetrics and Gynecology

## 2011-10-07 VITALS — BP 120/74 | Ht 62.0 in | Wt 175.0 lb

## 2011-10-07 DIAGNOSIS — E7800 Pure hypercholesterolemia, unspecified: Secondary | ICD-10-CM

## 2011-10-07 DIAGNOSIS — R102 Pelvic and perineal pain: Secondary | ICD-10-CM

## 2011-10-07 DIAGNOSIS — N949 Unspecified condition associated with female genital organs and menstrual cycle: Secondary | ICD-10-CM

## 2011-10-07 DIAGNOSIS — Z01419 Encounter for gynecological examination (general) (routine) without abnormal findings: Secondary | ICD-10-CM

## 2011-10-07 NOTE — Progress Notes (Signed)
The patient reports:normal menses, no abnormal bleeding, pelvic pain or discharge  Contraception:no method  Last mammogram: was normal November 2012 Last pap: was normal July  2012  GC/Chlamydia cultures offered: declined HIV/RPR/HbsAg offered:  Declined  HSV 1 and 2 glycoprotein offered: declined  Menstrual cycle regular and monthly: Yes Menstrual flow normal: Yes  Urinary symptoms: none Normal bowel movements: Yes Reports abuse at home: No Pt c/o of right side pain   Subjective:    [name] [name] is a 46 y.o. female, G5P5, who presents for an annual exam.     History   Social History  . Marital Status: Married    Spouse Name: N/A    Number of Children: N/A  . Years of Education: N/A   Social History Main Topics  . Smoking status: Never Smoker   . Smokeless tobacco: Never Used  . Alcohol Use: No  . Drug Use: No  . Sexually Active: No   Other Topics Concern  . None   Social History Narrative  . None    Menstrual cycle:   LMP: Patient's last menstrual period was 09/05/2011.           Cycle: normal  C/O RLQ pain, sharp pain, 4-5 years ago since cesarean birth 2009, triggered by pressure( can not sleep on that side), lasts a few seconds, intensity up to 10/10, no radiation. Improved by standing.  The following portions of the patient's history were reviewed and updated as appropriate: allergies, current medications, past family history, past medical history, past social history, past surgical history and problem list.  Review of Systems Pertinent items are noted in HPI. Breast:Negative for breast lump,nipple discharge or nipple retraction Gastrointestinal: Negative for abdominal pain, change in bowel habits or rectal bleeding Urinary:negative   Objective:    BP 120/74  Ht 5\' 2"  (1.575 m)  Wt 175 lb (79.379 kg)  BMI 32.01 kg/m2  LMP 09/05/2011    Weight:  Wt Readings from Last 1 Encounters:  10/07/11 175 lb (79.379 kg)          BMI: Body mass index is 32.01  kg/(m^2).  General Appearance: Alert, appropriate appearance for age. No acute distress HEENT: Grossly normal Neck / Thyroid: Supple, no masses, nodes or enlargement Lungs: clear to auscultation bilaterally Back: No CVA tenderness Breast Exam: No masses or nodes.No dimpling, nipple retraction or discharge. Cardiovascular: Regular rate and rhythm. S1, S2, no murmur Gastrointestinal: Soft, non-tender, no masses or organomegaly Pelvic Exam: Vulva and vagina appear normal. Bimanual exam reveals normal uterus and adnexa.Cervix anterior +++ Rectovaginal: normal rectal, no masses Lymphatic Exam: Non-palpable nodes in neck, clavicular, axillary, or inguinal regions  Skin: no rash or abnormalities Neurologic: Normal gait and speech, no tremor  Psychiatric: Alert and oriented, appropriate affect.   Wet Prep:not applicable Urinalysis:not applicable UPT: Not done   Assessment:    Normal gyn exam  Unexplained RLQ pain   Plan:    mammogram pap smear return annually or prn STD screening: declined Contraception:no method  return for pelvic ultrasound

## 2011-10-08 LAB — PAP IG W/ RFLX HPV ASCU

## 2011-10-27 ENCOUNTER — Other Ambulatory Visit: Payer: Self-pay | Admitting: Obstetrics and Gynecology

## 2011-10-27 ENCOUNTER — Ambulatory Visit (INDEPENDENT_AMBULATORY_CARE_PROVIDER_SITE_OTHER): Payer: BC Managed Care – PPO | Admitting: Obstetrics and Gynecology

## 2011-10-27 ENCOUNTER — Ambulatory Visit (INDEPENDENT_AMBULATORY_CARE_PROVIDER_SITE_OTHER): Payer: BC Managed Care – PPO

## 2011-10-27 ENCOUNTER — Encounter: Payer: Self-pay | Admitting: Obstetrics and Gynecology

## 2011-10-27 VITALS — BP 118/78 | HR 76 | Wt 170.0 lb

## 2011-10-27 DIAGNOSIS — R102 Pelvic and perineal pain: Secondary | ICD-10-CM

## 2011-10-27 DIAGNOSIS — D259 Leiomyoma of uterus, unspecified: Secondary | ICD-10-CM

## 2011-10-27 DIAGNOSIS — D219 Benign neoplasm of connective and other soft tissue, unspecified: Secondary | ICD-10-CM | POA: Insufficient documentation

## 2011-10-27 DIAGNOSIS — N949 Unspecified condition associated with female genital organs and menstrual cycle: Secondary | ICD-10-CM

## 2011-10-27 NOTE — Patient Instructions (Signed)
Patient Education Materials to be provided at check out (*indicates is located in Garment/textile technologist):  Uterine Fibroids

## 2011-10-27 NOTE — Progress Notes (Signed)
Subjective:    [name] [name] is a 46 y.o. female, G5P5, who presents for Gyn ultrasound because of  Right side pelvic pain .  Seen 10/07/11: was C/O RLQ pain, sharp pain, 4-5 years ago since cesarean birth 2009, triggered by pressure( can not sleep on that side), lasts a few seconds, intensity up to 10/10, no radiation. Improved by standing   The following portions of the patient's history were reviewed and updated as appropriate: allergies, current medications, past family history.  Objective:    BP 118/78  Pulse 76  Wt 170 lb (77.111 kg)  LMP 10/08/2011    Weight:  Wt Readings from Last 1 Encounters:  10/27/11 170 lb (77.111 kg)          BMI: There is no height on file to calculate BMI.  ULTRASOUND: Uterus 4 x 8 x 5 cm    Adnexa normal x 2    Endometrium 7.4  mm    Free fluid: no    Other findings:  1 left subserosal fibroid measuring 3.4 cm   Assessment:    uterine fibroids  unlikely responsible for the pain described. Suspect musculoskeletal issue.    Plan:    Follow-up with PCP Info given on fibroids  Silverio Lay MD

## 2012-06-15 ENCOUNTER — Other Ambulatory Visit (HOSPITAL_COMMUNITY): Payer: Self-pay | Admitting: Family Medicine

## 2012-06-15 DIAGNOSIS — Z1231 Encounter for screening mammogram for malignant neoplasm of breast: Secondary | ICD-10-CM

## 2012-06-16 ENCOUNTER — Ambulatory Visit (HOSPITAL_COMMUNITY): Payer: BC Managed Care – PPO

## 2012-06-16 ENCOUNTER — Ambulatory Visit (HOSPITAL_COMMUNITY)
Admission: RE | Admit: 2012-06-16 | Discharge: 2012-06-16 | Disposition: A | Discharge status: Home / Self Care | Payer: BC Managed Care – PPO | Source: Ambulatory Visit | Attending: Family Medicine | Admitting: Family Medicine

## 2012-06-16 ENCOUNTER — Other Ambulatory Visit (HOSPITAL_COMMUNITY)
Admission: RE | Admit: 2012-06-16 | Discharge: 2012-06-16 | Disposition: A | Discharge status: Home / Self Care | Payer: BC Managed Care – PPO | Source: Ambulatory Visit | Attending: Family Medicine | Admitting: Family Medicine

## 2012-06-16 ENCOUNTER — Other Ambulatory Visit: Payer: Self-pay | Admitting: Family Medicine

## 2012-06-16 DIAGNOSIS — Z01419 Encounter for gynecological examination (general) (routine) without abnormal findings: Secondary | ICD-10-CM | POA: Insufficient documentation

## 2012-06-16 DIAGNOSIS — Z1231 Encounter for screening mammogram for malignant neoplasm of breast: Secondary | ICD-10-CM | POA: Insufficient documentation

## 2012-12-19 ENCOUNTER — Emergency Department (HOSPITAL_COMMUNITY): Payer: BC Managed Care – PPO

## 2012-12-19 ENCOUNTER — Encounter (HOSPITAL_COMMUNITY): Payer: Self-pay | Admitting: Emergency Medicine

## 2012-12-19 ENCOUNTER — Emergency Department (HOSPITAL_COMMUNITY)
Admission: EM | Admit: 2012-12-19 | Discharge: 2012-12-20 | Disposition: A | Discharge status: Home / Self Care | Payer: BC Managed Care – PPO | Attending: Emergency Medicine | Admitting: Emergency Medicine

## 2012-12-19 DIAGNOSIS — R42 Dizziness and giddiness: Secondary | ICD-10-CM | POA: Insufficient documentation

## 2012-12-19 DIAGNOSIS — J111 Influenza due to unidentified influenza virus with other respiratory manifestations: Secondary | ICD-10-CM | POA: Insufficient documentation

## 2012-12-19 DIAGNOSIS — M6281 Muscle weakness (generalized): Secondary | ICD-10-CM | POA: Insufficient documentation

## 2012-12-19 DIAGNOSIS — M545 Low back pain, unspecified: Secondary | ICD-10-CM | POA: Insufficient documentation

## 2012-12-19 DIAGNOSIS — D649 Anemia, unspecified: Secondary | ICD-10-CM | POA: Insufficient documentation

## 2012-12-19 DIAGNOSIS — R112 Nausea with vomiting, unspecified: Secondary | ICD-10-CM | POA: Insufficient documentation

## 2012-12-19 DIAGNOSIS — Z79899 Other long term (current) drug therapy: Secondary | ICD-10-CM | POA: Insufficient documentation

## 2012-12-19 DIAGNOSIS — R51 Headache: Secondary | ICD-10-CM | POA: Insufficient documentation

## 2012-12-19 DIAGNOSIS — IMO0001 Reserved for inherently not codable concepts without codable children: Secondary | ICD-10-CM | POA: Insufficient documentation

## 2012-12-19 DIAGNOSIS — R059 Cough, unspecified: Secondary | ICD-10-CM | POA: Insufficient documentation

## 2012-12-19 DIAGNOSIS — R05 Cough: Secondary | ICD-10-CM | POA: Insufficient documentation

## 2012-12-19 MED ORDER — ONDANSETRON HCL 4 MG/2ML IJ SOLN
4.0000 mg | Freq: Once | INTRAMUSCULAR | Status: AC
  Administered 2012-12-19: 4 mg via INTRAVENOUS
  Filled 2012-12-19: qty 2

## 2012-12-19 MED ORDER — MORPHINE SULFATE 4 MG/ML IJ SOLN: 4.0000 mg | INTRAMUSCULAR | Status: DC | PRN

## 2012-12-19 MED ORDER — SODIUM CHLORIDE 0.9 % IV BOLUS (SEPSIS)
1000.0000 mL | Freq: Once | INTRAVENOUS | Status: AC
  Administered 2012-12-19: 1000 mL via INTRAVENOUS

## 2012-12-19 MED ORDER — ACETAMINOPHEN 325 MG PO TABS
650.0000 mg | ORAL_TABLET | Freq: Four times a day (QID) | ORAL | Status: DC | PRN
  Administered 2012-12-19: 650 mg via ORAL

## 2012-12-19 MED ORDER — ACETAMINOPHEN 325 MG PO TABS
650.0000 mg | ORAL_TABLET | Freq: Once | ORAL | Status: AC
  Administered 2012-12-19: 650 mg via ORAL
  Filled 2012-12-19: qty 2

## 2012-12-19 MED ORDER — KETOROLAC TROMETHAMINE 30 MG/ML IJ SOLN
30.0000 mg | Freq: Once | INTRAMUSCULAR | Status: AC
  Administered 2012-12-19: 30 mg via INTRAVENOUS
  Filled 2012-12-19: qty 1

## 2012-12-19 MED ORDER — ONDANSETRON 4 MG PO TBDP
8.0000 mg | ORAL_TABLET | ORAL | Status: AC
  Administered 2012-12-19: 8 mg via ORAL
  Filled 2012-12-19: qty 2

## 2012-12-19 NOTE — ED Notes (Signed)
Pt stated that every time she has a coughing spell it causes her to have a period of incontinenece

## 2012-12-19 NOTE — ED Notes (Signed)
Pt reports having cold symptoms x 2-3 days but also having chills, fever, back pain, productive cough. Went to pcp today and was told she has pneumonia. Airway intact at triage.

## 2012-12-19 NOTE — ED Notes (Signed)
Pt blood pressure dropping, MD ordered second bag of fluid, 1st bag infusing, will continue to monitor

## 2012-12-20 LAB — CBC WITH DIFFERENTIAL/PLATELET
Basophils Absolute: 0 10*3/uL (ref 0.0–0.1)
Eosinophils Absolute: 0 10*3/uL (ref 0.0–0.7)
Lymphocytes Relative: 11 % — ABNORMAL LOW (ref 12–46)
MCH: 21.7 pg — ABNORMAL LOW (ref 26.0–34.0)
MCHC: 31 g/dL (ref 30.0–36.0)
Neutro Abs: 6 10*3/uL (ref 1.7–7.7)
Neutrophils Relative %: 78 % — ABNORMAL HIGH (ref 43–77)
Platelets: 279 10*3/uL (ref 150–400)
RDW: 18.7 % — ABNORMAL HIGH (ref 11.5–15.5)
WBC: 7.7 10*3/uL (ref 4.0–10.5)

## 2012-12-20 LAB — BASIC METABOLIC PANEL
CO2: 23 mEq/L (ref 19–32)
Chloride: 106 mEq/L (ref 96–112)
GFR calc Af Amer: >90 mL/min (ref >90)
Sodium: 138 mEq/L (ref 135–145)

## 2012-12-20 LAB — CG4 I-STAT (LACTIC ACID): Lactic Acid, Venous: 0.88 mmol/L (ref 0.5–2.2)

## 2012-12-20 MED ORDER — IBUPROFEN 800 MG PO TABS: 800.0000 mg | ORAL_TABLET | Freq: Three times a day (TID) | ORAL | Status: AC

## 2012-12-20 MED ORDER — HYDROCOD POLST-CHLORPHEN POLST 10-8 MG/5ML PO LQCR: 5.0000 mL | Freq: Two times a day (BID) | ORAL | Status: DC

## 2012-12-20 MED ORDER — ONDANSETRON 4 MG PO TBDP: 4.0000 mg | ORAL_TABLET | Freq: Three times a day (TID) | ORAL | Status: DC | PRN

## 2012-12-20 NOTE — ED Provider Notes (Addendum)
CSN: [id number]     Arrival date & time 12/19/12  1709 History   First MD Initiated Contact with Patient 12/19/12 2117     Chief Complaint  Patient presents with  . Fever  . Pneumonia    HPI  47 year old female seen by her primary care physician with 24 hours of fevers body aches and a cough. Diagnosed with a swab with influenza. Refer to the emergency room for consideration for possible pneumonia. She has a cough. She denies shortness of breath. She had fevers shakes and chills today. Dry nonproductive cough. Mild headache. Diffuse myalgias. No dysuria urgency hematuria. One episode of vomiting yesterday and one today.  Past Medical History  Diagnosis Date  . Anemia    Past Surgical History  Procedure Laterality Date  . Cesarean section      2 times    Family History  Problem Relation Age of Onset  . Diabetes Mother   . Asthma Son   . Cancer Brother 32    blood cancer   History  Substance Use Topics  . Smoking status: Never Smoker   . Smokeless tobacco: Never Used  . Alcohol Use: No   OB History   Grav Para Term Preterm Abortions TAB SAB Ect Mult Living   5 5       1 6      Review of Systems  Constitutional: Positive for fever. Negative for chills, diaphoresis, appetite change and fatigue.  HENT: Negative for mouth sores, sore throat and trouble swallowing.   Eyes: Negative for visual disturbance.  Respiratory: Positive for cough. Negative for chest tightness, shortness of breath and wheezing.   Cardiovascular: Negative for chest pain.  Gastrointestinal: Positive for nausea and vomiting. Negative for abdominal pain, diarrhea and abdominal distention.  Endocrine: Negative for polydipsia, polyphagia and polyuria.  Genitourinary: Negative for dysuria, frequency and hematuria.  Musculoskeletal: Negative for gait problem.  Skin: Negative for color change, pallor and rash.  Neurological: Positive for dizziness, weakness and headaches. Negative for syncope and  light-headedness.  Hematological: Does not bruise/bleed easily.  Psychiatric/Behavioral: Negative for behavioral problems and confusion.    Allergies  Review of patient's allergies indicates no known allergies.  Home Medications   Current Outpatient Rx  Name  Route  Sig  Dispense  Refill  . calcium carbonate 200 MG capsule   Oral   Take 250 mg by mouth 2 (two) times daily with a meal.         . oseltamivir (TAMIFLU) 75 MG capsule   Oral   Take 75 mg by mouth 2 (two) times daily. For 5 days         . rosuvastatin (CRESTOR) 20 MG tablet   Oral   Take 20 mg by mouth daily.         . chlorpheniramine-HYDROcodone (TUSSIONEX PENNKINETIC ER) 10-8 MG/5ML LQCR   Oral   Take 5 mLs by mouth every 12 (twelve) hours.   60 mL   0   . ibuprofen (ADVIL,MOTRIN) 800 MG tablet   Oral   Take 1 tablet (800 mg total) by mouth 3 (three) times daily.   21 tablet   0   . ondansetron (ZOFRAN ODT) 4 MG disintegrating tablet   Oral   Take 1 tablet (4 mg total) by mouth every 8 (eight) hours as needed for nausea.   20 tablet   0    BP 99/55  Pulse 84  Temp(Src) 100.8 F (38.2 C) (Oral)  Resp 18  Ht  5\' 2"  (1.575 m)  Wt 168 lb 11.2 oz (76.522 kg)  BMI 30.85 kg/m2  SpO2 96%  LMP 11/30/2012 Physical Exam  Constitutional: She is oriented to person, place, and time. She appears well-developed and well-nourished. She appears ill. No distress.  HENT:  Head: Normocephalic.  Eyes: Conjunctivae are normal. Pupils are equal, round, and reactive to light. No scleral icterus.  Conjunctiva are not pale  Neck: Normal range of motion. Neck supple. No thyromegaly present.  Cardiovascular: Normal rate and regular rhythm.  Exam reveals no gallop and no friction rub.   No murmur heard. Pulmonary/Chest: Effort normal and breath sounds normal. No respiratory distress. She has no wheezes. She has no rales.  Frequent cough. No abnormal breath sounds, no diminished breath sounds or prolongation.   Abdominal: Soft. Bowel sounds are normal. She exhibits no distension. There is no tenderness. There is no rebound.  Musculoskeletal: Normal range of motion.  Neurological: She is alert and oriented to person, place, and time.  Is awake alert and mentating well  Skin: Skin is warm and dry. No rash noted.  No rash  Psychiatric: She has a normal mood and affect. Her behavior is normal.    ED Course  Procedures (including critical care time) Labs Review Labs Reviewed  CBC WITH DIFFERENTIAL - Abnormal; Notable for the following:    Hemoglobin 9.2 (*)    HCT 29.7 (*)    MCV 70.0 (*)    MCH 21.7 (*)    RDW 18.7 (*)    Neutrophils Relative % 78 (*)    Lymphocytes Relative 11 (*)    All other components within normal limits  BASIC METABOLIC PANEL - Abnormal; Notable for the following:    Glucose, Bld 102 (*)    Calcium 7.8 (*)    GFR calc non Af Amer 78 (*)    All other components within normal limits  CG4 I-STAT (LACTIC ACID)   Imaging Review Dg Chest 2 View  12/19/2012   CLINICAL DATA:  Fever, vomiting, low back pain, flu like symptoms for 1 week, question pneumonia  EXAM: CHEST  2 VIEW  COMPARISON:  12/26/2007  FINDINGS: Normal heart size, mediastinal contours, and pulmonary vascularity.  Lungs clear.  Bones unremarkable.  No pneumothorax.  IMPRESSION: Normal exam.   Electronically Signed   By: Ulyses Southward M.D.   On: 12/19/2012 18:28    EKG Interpretation   None       MDM   1. Influenza    Initially her temperature had improved to some Tylenol. Unfortunately there was a long wait in the emergency room. Time I was able to examine her temperature had recurred and was 11.3. An IV was placed. Chest x-ray shows no pneumonia. Her lactate is 0.88. She was given morphine IV and Zofran IV 4 mg each. His given Toradol 30 mg IV.  Had an initial dip of her blood pressure to 82 systolic. I think this was shortly after her IV pain medications and related to such. After IV fluids this is  improving and is in the mid 90s. Second liter of fluids infusing. I do not think this is sepsis she is not tachycardic or hypoxemic she is actually resting comfortably and still mentating well. Her pressures improved with the rehydration and she is appropriate for outpatient treatment for influenza continue her Tamiflu with rest and rehydration.    Roney Marion, MD 12/20/12 [id number]  Roney Marion, MD 12/21/12 0001

## 2013-03-25 ENCOUNTER — Encounter (HOSPITAL_COMMUNITY): Payer: Self-pay | Admitting: Emergency Medicine

## 2013-03-25 ENCOUNTER — Emergency Department (HOSPITAL_COMMUNITY): Payer: BC Managed Care – PPO

## 2013-03-25 ENCOUNTER — Emergency Department (HOSPITAL_COMMUNITY)
Admission: EM | Admit: 2013-03-25 | Discharge: 2013-03-25 | Disposition: A | Discharge status: Home / Self Care | Payer: BC Managed Care – PPO | Attending: Emergency Medicine | Admitting: Emergency Medicine

## 2013-03-25 DIAGNOSIS — Z79899 Other long term (current) drug therapy: Secondary | ICD-10-CM | POA: Insufficient documentation

## 2013-03-25 DIAGNOSIS — R000 Tachycardia, unspecified: Secondary | ICD-10-CM | POA: Insufficient documentation

## 2013-03-25 DIAGNOSIS — Z862 Personal history of diseases of the blood and blood-forming organs and certain disorders involving the immune mechanism: Secondary | ICD-10-CM | POA: Insufficient documentation

## 2013-03-25 DIAGNOSIS — Z3202 Encounter for pregnancy test, result negative: Secondary | ICD-10-CM | POA: Insufficient documentation

## 2013-03-25 DIAGNOSIS — R112 Nausea with vomiting, unspecified: Secondary | ICD-10-CM | POA: Insufficient documentation

## 2013-03-25 DIAGNOSIS — Z791 Long term (current) use of non-steroidal anti-inflammatories (NSAID): Secondary | ICD-10-CM | POA: Insufficient documentation

## 2013-03-25 DIAGNOSIS — N200 Calculus of kidney: Secondary | ICD-10-CM | POA: Diagnosis present

## 2013-03-25 LAB — CBC WITH DIFFERENTIAL/PLATELET
BASOS PCT: 1 % (ref 0–1)
Basophils Absolute: 0.1 10*3/uL (ref 0.0–0.1)
EOS ABS: 0.3 10*3/uL (ref 0.0–0.7)
Eosinophils Relative: 3 % (ref 0–5)
HCT: 30.7 % — ABNORMAL LOW (ref 36.0–46.0)
Hemoglobin: 9.5 g/dL — ABNORMAL LOW (ref 12.0–15.0)
LYMPHS ABS: 2.2 10*3/uL (ref 0.7–4.0)
Lymphocytes Relative: 21 % (ref 12–46)
MCH: 20.8 pg — AB (ref 26.0–34.0)
MCHC: 30.9 g/dL (ref 30.0–36.0)
MCV: 67.2 fL — AB (ref 78.0–100.0)
Monocytes Absolute: 0.7 10*3/uL (ref 0.1–1.0)
Monocytes Relative: 7 % (ref 3–12)
Neutro Abs: 7.5 10*3/uL (ref 1.7–7.7)
Neutrophils Relative %: 69 % (ref 43–77)
PLATELETS: 406 10*3/uL — AB (ref 150–400)
RBC: 4.57 MIL/uL (ref 3.87–5.11)
RDW: 18.2 % — ABNORMAL HIGH (ref 11.5–15.5)
WBC: 10.8 10*3/uL — ABNORMAL HIGH (ref 4.0–10.5)

## 2013-03-25 LAB — COMPREHENSIVE METABOLIC PANEL WITH GFR
ALT: 7 U/L (ref 0–35)
AST: 12 U/L (ref 0–37)
Albumin: 3.2 g/dL — ABNORMAL LOW (ref 3.5–5.2)
Alkaline Phosphatase: 65 U/L (ref 39–117)
BUN: 13 mg/dL (ref 6–23)
CO2: 24 meq/L (ref 19–32)
Calcium: 8.8 mg/dL (ref 8.4–10.5)
Chloride: 102 meq/L (ref 96–112)
Creatinine, Ser: 0.78 mg/dL (ref 0.50–1.10)
GFR calc Af Amer: >90 mL/min (ref >90)
GFR calc non Af Amer: >90 mL/min (ref >90)
Glucose, Bld: 104 mg/dL — ABNORMAL HIGH (ref 70–99)
Potassium: 4 meq/L (ref 3.7–5.3)
Sodium: 140 meq/L (ref 137–147)
Total Bilirubin: 0.4 mg/dL (ref 0.3–1.2)
Total Protein: 7.5 g/dL (ref 6.0–8.3)

## 2013-03-25 LAB — URINALYSIS, ROUTINE W REFLEX MICROSCOPIC
BILIRUBIN URINE: NEGATIVE
GLUCOSE, UA: NEGATIVE mg/dL
Hgb urine dipstick: NEGATIVE
KETONES UR: NEGATIVE mg/dL
Nitrite: NEGATIVE
Protein, ur: NEGATIVE mg/dL
Specific Gravity, Urine: 1.021 (ref 1.005–1.030)
Urobilinogen, UA: 1 mg/dL (ref 0.0–1.0)
pH: 6.5 (ref 5.0–8.0)

## 2013-03-25 LAB — URINE MICROSCOPIC-ADD ON

## 2013-03-25 LAB — POC URINE PREG, ED: Preg Test, Ur: NEGATIVE

## 2013-03-25 MED ORDER — TAMSULOSIN HCL 0.4 MG PO CAPS: 0.4000 mg | ORAL_CAPSULE | Freq: Every day | ORAL | Status: AC

## 2013-03-25 MED ORDER — OXYCODONE-ACETAMINOPHEN 5-325 MG PO TABS: 1.0000 | ORAL_TABLET | Freq: Four times a day (QID) | ORAL | Status: AC | PRN

## 2013-03-25 MED ORDER — ONDANSETRON HCL 4 MG/2ML IJ SOLN
4.0000 mg | Freq: Once | INTRAMUSCULAR | Status: AC
  Administered 2013-03-25: 4 mg via INTRAVENOUS
  Filled 2013-03-25: qty 2

## 2013-03-25 MED ORDER — HYDROMORPHONE HCL PF 1 MG/ML IJ SOLN
1.0000 mg | INTRAMUSCULAR | Status: AC
  Administered 2013-03-25: 1 mg via INTRAVENOUS
  Filled 2013-03-25: qty 1

## 2013-03-25 MED ORDER — ONDANSETRON 4 MG PO TBDP: ORAL_TABLET | ORAL | Status: AC

## 2013-03-25 MED ORDER — SODIUM CHLORIDE 0.9 % IV BOLUS (SEPSIS)
1000.0000 mL | INTRAVENOUS | Status: AC
  Administered 2013-03-25: 1000 mL via INTRAVENOUS

## 2013-03-25 NOTE — ED Notes (Signed)
Pt c/o left lower abdominal pain with nausea and vomiting onset Friday. Pt tried ibuprofen without relief. Pt reports that she had a fever at home but did not check her temperature. Last dose of ibuprofen was 30 mins PTA.

## 2013-03-25 NOTE — ED Notes (Signed)
Pt taken to US

## 2013-03-25 NOTE — ED Provider Notes (Signed)
CSN: [id number]     Arrival date & time 03/25/13  1640 History   First MD Initiated Contact with Patient 03/25/13 1651     Chief Complaint  Patient presents with  . Abdominal Pain     (Consider location/radiation/quality/duration/timing/severity/associated sxs/prior Treatment) Patient is a 48 y.o. female presenting with abdominal pain. The history is provided by the patient.  Abdominal Pain Pain location:  L flank Pain quality: aching and sharp   Pain radiates to:  Does not radiate Pain severity:  Moderate Onset quality:  Sudden Duration:  2 days Timing:  Intermittent Progression:  Unchanged Chronicity:  Recurrent Context comment:  While at work Relieved by: ibuprofen. Worsened by:  Nothing tried Ineffective treatments:  None tried Associated symptoms: nausea and vomiting   Associated symptoms: no chest pain, no cough, no diarrhea, no dysuria, no fatigue, no fever, no hematuria and no shortness of breath     Past Medical History  Diagnosis Date  . Anemia    Past Surgical History  Procedure Laterality Date  . Cesarean section      2 times    Family History  Problem Relation Age of Onset  . Diabetes Mother   . Asthma Son   . Cancer Brother 50    blood cancer   History  Substance Use Topics  . Smoking status: Never Smoker   . Smokeless tobacco: Never Used  . Alcohol Use: No   OB History   Grav Para Term Preterm Abortions TAB SAB Ect Mult Living   5 5       1 6      Review of Systems  Constitutional: Negative for fever and fatigue.  HENT: Negative for congestion and drooling.   Eyes: Negative for pain.  Respiratory: Negative for cough and shortness of breath.   Cardiovascular: Negative for chest pain.  Gastrointestinal: Positive for nausea, vomiting and abdominal pain. Negative for diarrhea.  Genitourinary: Negative for dysuria and hematuria.  Musculoskeletal: Negative for back pain, gait problem and neck pain.  Skin: Negative for color change.   Neurological: Negative for dizziness and headaches.  Hematological: Negative for adenopathy.  Psychiatric/Behavioral: Negative for behavioral problems.  All other systems reviewed and are negative.      Allergies  Review of patient's allergies indicates no known allergies.  Home Medications   Current Outpatient Rx  Name  Route  Sig  Dispense  Refill  . ibuprofen (ADVIL,MOTRIN) 800 MG tablet   Oral   Take 1 tablet (800 mg total) by mouth 3 (three) times daily.   21 tablet   0   . rosuvastatin (CRESTOR) 20 MG tablet   Oral   Take 20 mg by mouth daily.          BP 124/74  Pulse 101  Temp(Src) 99 F (37.2 C) (Oral)  Resp 18  Ht 5\' 1"  (1.549 m)  Wt 165 lb (74.844 kg)  BMI 31.19 kg/m2  SpO2 98%  LMP 03/23/2013 Physical Exam  Nursing note and vitals reviewed. Constitutional: She is oriented to person, place, and time. She appears well-developed and well-nourished.  HENT:  Head: Normocephalic.  Mouth/Throat: Oropharynx is clear and moist. No oropharyngeal exudate.  Eyes: Conjunctivae and EOM are normal. Pupils are equal, round, and reactive to light.  Neck: Normal range of motion. Neck supple.  Cardiovascular: Regular rhythm, normal heart sounds and intact distal pulses.  Exam reveals no gallop and no friction rub.   No murmur heard. HR 101  Pulmonary/Chest: Effort normal and breath  sounds normal. No respiratory distress. She has no wheezes.  Abdominal: Soft. Bowel sounds are normal. There is tenderness (mild tenderness to palpation of the left side of abdomen and left flank.). There is no rebound and no guarding.  Musculoskeletal: Normal range of motion. She exhibits no edema and no tenderness.  Neurological: She is alert and oriented to person, place, and time.  Skin: Skin is warm and dry.  Psychiatric: She has a normal mood and affect. Her behavior is normal.    ED Course  Procedures (including critical care time) Labs Review Labs Reviewed  CBC WITH  DIFFERENTIAL - Abnormal; Notable for the following:    WBC 10.8 (*)    Hemoglobin 9.5 (*)    HCT 30.7 (*)    MCV 67.2 (*)    MCH 20.8 (*)    RDW 18.2 (*)    Platelets 406 (*)    All other components within normal limits  COMPREHENSIVE METABOLIC PANEL - Abnormal; Notable for the following:    Glucose, Bld 104 (*)    Albumin 3.2 (*)    All other components within normal limits  URINALYSIS, ROUTINE W REFLEX MICROSCOPIC - Abnormal; Notable for the following:    Leukocytes, UA TRACE (*)    All other components within normal limits  URINE MICROSCOPIC-ADD ON - Abnormal; Notable for the following:    Squamous Epithelial / LPF FEW (*)    All other components within normal limits  URINE CULTURE  POC URINE PREG, ED   Imaging Review No results found.   EKG Interpretation None      MDM   Final diagnoses:  Nephrolithiasis    5:17 PM 49 y.o. female who presents with left flank pain which began 2 days ago. She's had some nausea and mild vomiting this morning. Subjective fever at home.  She is afebrile and mildly tachycardic here. She notes that her symptoms are consistent with previous kidney stones. Will get screening labwork, urine, and renal ultrasound. Pt has previous CT's w/ kidney stones in review of records.   11:00 PM: Renal US non-contrib. Mildly elev wbc count. UA neg. Pt's pain controlled.  I discussed the possibility that although her sx are c/w previous kidney stones, she should return for any worsening or new sx as it is always possible there are other causes of her pain.  I have discussed the diagnosis/risks/treatment options with the patient and believe the pt to be eligible for discharge home to follow-up with pcp as needed or return for any worsening. We also discussed returning to the ED immediately if new or worsening sx occur. We discussed the sx which are most concerning (e.g., fever, vomiting, worsening abd pain) that necessitate immediate return. Medications administered  to the patient during their visit and any new prescriptions provided to the patient are listed below.  Medications given during this visit Medications  sodium chloride 0.9 % bolus 1,000 mL (0 mLs Intravenous Stopped 03/25/13 1922)  HYDROmorphone (DILAUDID) injection 1 mg (1 mg Intravenous Given 03/25/13 1821)  ondansetron (ZOFRAN) injection 4 mg (4 mg Intravenous Given 03/25/13 1820)  HYDROmorphone (DILAUDID) injection 1 mg (1 mg Intravenous Given 03/25/13 2109)    Discharge Medication List as of 03/25/2013 11:01 PM    START taking these medications   Details  ondansetron (ZOFRAN ODT) 4 MG disintegrating tablet 4mg  ODT q4 hours prn nausea/vomit, Print    oxyCODONE-acetaminophen (PERCOCET) 5-325 MG per tablet Take 1-2 tablets by mouth every 6 (six) hours as needed for moderate  pain., Starting 03/25/2013, Until Discontinued, Print    tamsulosin (FLOMAX) 0.4 MG CAPS capsule Take 1 capsule (0.4 mg total) by mouth daily., Starting 03/25/2013, Until Discontinued, Print         Blanchard Kelch, MD 03/27/13 2017

## 2013-03-25 NOTE — Discharge Instructions (Signed)
Kidney Stones  Kidney stones (urolithiasis) are deposits that form inside your kidneys. The intense pain is caused by the stone moving through the urinary tract. When the stone moves, the ureter goes into spasm around the stone. The stone is usually passed in the urine.   CAUSES   · A disorder that makes certain neck glands produce too much parathyroid hormone (primary hyperparathyroidism).  · A buildup of uric acid crystals, similar to gout in your joints.  · Narrowing (stricture) of the ureter.  · A kidney obstruction present at birth (congenital obstruction).  · Previous surgery on the kidney or ureters.  · Numerous kidney infections.  SYMPTOMS   · Feeling sick to your stomach (nauseous).  · Throwing up (vomiting).  · Blood in the urine (hematuria).  · Pain that usually spreads (radiates) to the groin.  · Frequency or urgency of urination.  DIAGNOSIS   · Taking a history and physical exam.  · Blood or urine tests.  · CT scan.  · Occasionally, an examination of the inside of the urinary bladder (cystoscopy) is performed.  TREATMENT   · Observation.  · Increasing your fluid intake.  · Extracorporeal shock wave lithotripsy This is a noninvasive procedure that uses shock waves to break up kidney stones.  · Surgery may be needed if you have severe pain or persistent obstruction. There are various surgical procedures. Most of the procedures are performed with the use of small instruments. Only small incisions are needed to accommodate these instruments, so recovery time is minimized.  The size, location, and chemical composition are all important variables that will determine the proper choice of action for you. Talk to your health care provider to better understand your situation so that you will minimize the risk of injury to yourself and your kidney.   HOME CARE INSTRUCTIONS   · Drink enough water and fluids to keep your urine clear or pale yellow. This will help you to pass the stone or stone fragments.  · Strain  all urine through the provided strainer. Keep all particulate matter and stones for your health care provider to see. The stone causing the pain may be as small as a grain of salt. It is very important to use the strainer each and every time you pass your urine. The collection of your stone will allow your health care provider to analyze it and verify that a stone has actually passed. The stone analysis will often identify what you can do to reduce the incidence of recurrences.  · Only take over-the-counter or prescription medicines for pain, discomfort, or fever as directed by your health care provider.  · Make a follow-up appointment with your health care provider as directed.  · Get follow-up X-rays if required. The absence of pain does not always mean that the stone has passed. It may have only stopped moving. If the urine remains completely obstructed, it can cause loss of kidney function or even complete destruction of the kidney. It is your responsibility to make sure X-rays and follow-ups are completed. Ultrasounds of the kidney can show blockages and the status of the kidney. Ultrasounds are not associated with any radiation and can be performed easily in a matter of minutes.  SEEK MEDICAL CARE IF:  · You experience pain that is progressive and unresponsive to any pain medicine you have been prescribed.  SEEK IMMEDIATE MEDICAL CARE IF:   · Pain cannot be controlled with the prescribed medicine.  · You have a fever   or shaking chills.  · The severity or intensity of pain increases over 18 hours and is not relieved by pain medicine.  · You develop a new onset of abdominal pain.  · You feel faint or pass out.  · You are unable to urinate.  MAKE SURE YOU:   · Understand these instructions.  · Will watch your condition.  · Will get help right away if you are not doing well or get worse.  Document Released: 12/21/2004 Document Revised: 08/23/2012 Document Reviewed: 05/24/2012  [employer]® Patient Information ©2014  [employer].

## 2013-03-26 LAB — URINE CULTURE
Colony Count: NO GROWTH
Culture: NO GROWTH

## 2013-09-21 ENCOUNTER — Other Ambulatory Visit: Payer: Self-pay | Admitting: Family Medicine

## 2013-09-21 ENCOUNTER — Other Ambulatory Visit (HOSPITAL_COMMUNITY)
Admission: RE | Admit: 2013-09-21 | Discharge: 2013-09-21 | Disposition: A | Discharge status: Home / Self Care | Payer: BC Managed Care – PPO | Source: Ambulatory Visit | Attending: Family Medicine | Admitting: Family Medicine

## 2013-09-21 DIAGNOSIS — Z01419 Encounter for gynecological examination (general) (routine) without abnormal findings: Secondary | ICD-10-CM | POA: Diagnosis present

## 2013-09-26 LAB — CYTOLOGY - PAP

## 2013-09-27 ENCOUNTER — Other Ambulatory Visit (HOSPITAL_COMMUNITY): Payer: Self-pay | Admitting: Family Medicine

## 2013-09-27 DIAGNOSIS — Z1231 Encounter for screening mammogram for malignant neoplasm of breast: Secondary | ICD-10-CM

## 2013-10-01 ENCOUNTER — Ambulatory Visit (HOSPITAL_COMMUNITY)
Admission: RE | Admit: 2013-10-01 | Discharge: 2013-10-01 | Disposition: A | Discharge status: Home / Self Care | Payer: BC Managed Care – PPO | Source: Ambulatory Visit | Attending: Family Medicine | Admitting: Family Medicine

## 2013-10-01 DIAGNOSIS — Z1231 Encounter for screening mammogram for malignant neoplasm of breast: Secondary | ICD-10-CM

## 2013-10-26 ENCOUNTER — Other Ambulatory Visit (HOSPITAL_COMMUNITY)
Admission: RE | Admit: 2013-10-26 | Discharge: 2013-10-26 | Disposition: A | Discharge status: Home / Self Care | Payer: BC Managed Care – PPO | Source: Ambulatory Visit | Attending: Family Medicine | Admitting: Family Medicine

## 2013-10-26 ENCOUNTER — Other Ambulatory Visit: Payer: Self-pay | Admitting: Family Medicine

## 2013-10-26 DIAGNOSIS — Z124 Encounter for screening for malignant neoplasm of cervix: Secondary | ICD-10-CM | POA: Insufficient documentation

## 2013-10-30 LAB — CYTOLOGY - PAP

## 2013-11-05 ENCOUNTER — Encounter (HOSPITAL_COMMUNITY): Payer: Self-pay | Admitting: Emergency Medicine

## 2014-11-11 ENCOUNTER — Other Ambulatory Visit: Payer: Self-pay | Admitting: Family Medicine

## 2014-11-11 ENCOUNTER — Other Ambulatory Visit (HOSPITAL_COMMUNITY)
Admission: RE | Admit: 2014-11-11 | Discharge: 2014-11-11 | Disposition: A | Discharge status: Home / Self Care | Payer: BLUE CROSS/BLUE SHIELD | Source: Ambulatory Visit | Attending: Family Medicine | Admitting: Family Medicine

## 2014-11-11 DIAGNOSIS — Z01419 Encounter for gynecological examination (general) (routine) without abnormal findings: Secondary | ICD-10-CM | POA: Diagnosis not present

## 2014-11-12 ENCOUNTER — Other Ambulatory Visit: Payer: Self-pay

## 2014-11-12 DIAGNOSIS — Z1231 Encounter for screening mammogram for malignant neoplasm of breast: Secondary | ICD-10-CM

## 2014-11-12 LAB — CYTOLOGY - PAP

## 2014-12-05 ENCOUNTER — Ambulatory Visit
Admission: RE | Admit: 2014-12-05 | Discharge: 2014-12-05 | Disposition: A | Discharge status: Home / Self Care | Payer: BLUE CROSS/BLUE SHIELD | Source: Ambulatory Visit

## 2014-12-05 DIAGNOSIS — Z1231 Encounter for screening mammogram for malignant neoplasm of breast: Secondary | ICD-10-CM

## 2015-06-10 DIAGNOSIS — R030 Elevated blood-pressure reading, without diagnosis of hypertension: Secondary | ICD-10-CM | POA: Diagnosis not present

## 2015-06-10 DIAGNOSIS — E785 Hyperlipidemia, unspecified: Secondary | ICD-10-CM | POA: Diagnosis not present

## 2015-06-10 DIAGNOSIS — Z23 Encounter for immunization: Secondary | ICD-10-CM | POA: Diagnosis not present

## 2015-09-02 DIAGNOSIS — D485 Neoplasm of uncertain behavior of skin: Secondary | ICD-10-CM | POA: Diagnosis not present

## 2015-09-02 DIAGNOSIS — R234 Changes in skin texture: Secondary | ICD-10-CM | POA: Diagnosis not present

## 2015-09-02 DIAGNOSIS — L309 Dermatitis, unspecified: Secondary | ICD-10-CM | POA: Diagnosis not present

## 2015-10-06 DIAGNOSIS — L700 Acne vulgaris: Secondary | ICD-10-CM | POA: Diagnosis not present

## 2015-10-06 DIAGNOSIS — L409 Psoriasis, unspecified: Secondary | ICD-10-CM | POA: Diagnosis not present

## 2015-11-11 ENCOUNTER — Other Ambulatory Visit: Payer: Self-pay | Admitting: Family Medicine

## 2015-11-11 DIAGNOSIS — Z1231 Encounter for screening mammogram for malignant neoplasm of breast: Secondary | ICD-10-CM

## 2015-12-09 ENCOUNTER — Ambulatory Visit
Admission: RE | Admit: 2015-12-09 | Discharge: 2015-12-09 | Disposition: A | Discharge status: Home / Self Care | Payer: BLUE CROSS/BLUE SHIELD | Source: Ambulatory Visit | Attending: Family Medicine | Admitting: Family Medicine

## 2015-12-09 DIAGNOSIS — Z1231 Encounter for screening mammogram for malignant neoplasm of breast: Secondary | ICD-10-CM

## 2015-12-10 ENCOUNTER — Other Ambulatory Visit: Payer: Self-pay | Admitting: Family Medicine

## 2015-12-10 DIAGNOSIS — R928 Other abnormal and inconclusive findings on diagnostic imaging of breast: Secondary | ICD-10-CM

## 2015-12-12 DIAGNOSIS — Z0000 Encounter for general adult medical examination without abnormal findings: Secondary | ICD-10-CM | POA: Diagnosis not present

## 2015-12-12 DIAGNOSIS — I10 Essential (primary) hypertension: Secondary | ICD-10-CM | POA: Diagnosis not present

## 2015-12-12 DIAGNOSIS — E785 Hyperlipidemia, unspecified: Secondary | ICD-10-CM | POA: Diagnosis not present

## 2015-12-12 DIAGNOSIS — D509 Iron deficiency anemia, unspecified: Secondary | ICD-10-CM | POA: Diagnosis not present

## 2015-12-22 ENCOUNTER — Ambulatory Visit
Admission: RE | Admit: 2015-12-22 | Discharge: 2015-12-22 | Disposition: A | Discharge status: Home / Self Care | Payer: BLUE CROSS/BLUE SHIELD | Source: Ambulatory Visit | Attending: Family Medicine | Admitting: Family Medicine

## 2015-12-22 DIAGNOSIS — R922 Inconclusive mammogram: Secondary | ICD-10-CM | POA: Diagnosis not present

## 2015-12-22 DIAGNOSIS — R928 Other abnormal and inconclusive findings on diagnostic imaging of breast: Secondary | ICD-10-CM

## 2016-01-12 DIAGNOSIS — I10 Essential (primary) hypertension: Secondary | ICD-10-CM | POA: Diagnosis not present

## 2016-01-12 DIAGNOSIS — L409 Psoriasis, unspecified: Secondary | ICD-10-CM | POA: Diagnosis not present

## 2016-02-11 DIAGNOSIS — D509 Iron deficiency anemia, unspecified: Secondary | ICD-10-CM | POA: Diagnosis not present

## 2016-02-11 DIAGNOSIS — I10 Essential (primary) hypertension: Secondary | ICD-10-CM | POA: Diagnosis not present

## 2016-02-11 DIAGNOSIS — E785 Hyperlipidemia, unspecified: Secondary | ICD-10-CM | POA: Diagnosis not present

## 2016-05-09 DIAGNOSIS — J209 Acute bronchitis, unspecified: Secondary | ICD-10-CM | POA: Diagnosis not present

## 2016-08-11 DIAGNOSIS — E785 Hyperlipidemia, unspecified: Secondary | ICD-10-CM | POA: Diagnosis not present

## 2016-08-11 DIAGNOSIS — I10 Essential (primary) hypertension: Secondary | ICD-10-CM | POA: Diagnosis not present

## 2016-09-10 DIAGNOSIS — I10 Essential (primary) hypertension: Secondary | ICD-10-CM | POA: Diagnosis not present

## 2016-09-10 DIAGNOSIS — Z23 Encounter for immunization: Secondary | ICD-10-CM | POA: Diagnosis not present

## 2017-02-09 DIAGNOSIS — F418 Other specified anxiety disorders: Secondary | ICD-10-CM | POA: Diagnosis not present

## 2017-02-09 DIAGNOSIS — D509 Iron deficiency anemia, unspecified: Secondary | ICD-10-CM | POA: Diagnosis not present

## 2017-02-09 DIAGNOSIS — I10 Essential (primary) hypertension: Secondary | ICD-10-CM | POA: Diagnosis not present

## 2017-02-09 DIAGNOSIS — E785 Hyperlipidemia, unspecified: Secondary | ICD-10-CM | POA: Diagnosis not present

## 2017-02-24 DIAGNOSIS — H5203 Hypermetropia, bilateral: Secondary | ICD-10-CM | POA: Diagnosis not present

## 2017-02-24 DIAGNOSIS — H524 Presbyopia: Secondary | ICD-10-CM | POA: Diagnosis not present

## 2017-02-24 DIAGNOSIS — H52223 Regular astigmatism, bilateral: Secondary | ICD-10-CM | POA: Diagnosis not present

## 2017-03-28 DIAGNOSIS — H40033 Anatomical narrow angle, bilateral: Secondary | ICD-10-CM | POA: Diagnosis not present

## 2017-03-28 DIAGNOSIS — H2513 Age-related nuclear cataract, bilateral: Secondary | ICD-10-CM | POA: Diagnosis not present

## 2017-05-12 DIAGNOSIS — Z23 Encounter for immunization: Secondary | ICD-10-CM | POA: Diagnosis not present

## 2017-08-09 DIAGNOSIS — E785 Hyperlipidemia, unspecified: Secondary | ICD-10-CM | POA: Diagnosis not present

## 2017-08-09 DIAGNOSIS — Z0000 Encounter for general adult medical examination without abnormal findings: Secondary | ICD-10-CM | POA: Diagnosis not present

## 2017-08-09 DIAGNOSIS — I10 Essential (primary) hypertension: Secondary | ICD-10-CM | POA: Diagnosis not present

## 2017-08-23 ENCOUNTER — Other Ambulatory Visit (HOSPITAL_COMMUNITY)
Admission: RE | Admit: 2017-08-23 | Discharge: 2017-08-23 | Disposition: A | Discharge status: Home / Self Care | Payer: BLUE CROSS/BLUE SHIELD | Source: Ambulatory Visit | Attending: Family Medicine | Admitting: Family Medicine

## 2017-08-23 ENCOUNTER — Other Ambulatory Visit: Payer: Self-pay | Admitting: Family Medicine

## 2017-08-23 DIAGNOSIS — Z01419 Encounter for gynecological examination (general) (routine) without abnormal findings: Secondary | ICD-10-CM | POA: Insufficient documentation

## 2017-08-25 LAB — CYTOLOGY - PAP
Diagnosis: NEGATIVE
HPV: NOT DETECTED

## 2017-10-21 ENCOUNTER — Other Ambulatory Visit: Payer: Self-pay | Admitting: Family Medicine

## 2017-10-21 DIAGNOSIS — Z1231 Encounter for screening mammogram for malignant neoplasm of breast: Secondary | ICD-10-CM

## 2017-11-29 ENCOUNTER — Ambulatory Visit
Admission: RE | Admit: 2017-11-29 | Discharge: 2017-11-29 | Disposition: A | Discharge status: Home / Self Care | Payer: BLUE CROSS/BLUE SHIELD | Source: Ambulatory Visit | Attending: Family Medicine | Admitting: Family Medicine

## 2017-11-29 DIAGNOSIS — Z1231 Encounter for screening mammogram for malignant neoplasm of breast: Secondary | ICD-10-CM

## 2017-12-05 ENCOUNTER — Other Ambulatory Visit: Payer: Self-pay | Admitting: Family Medicine

## 2017-12-05 DIAGNOSIS — R928 Other abnormal and inconclusive findings on diagnostic imaging of breast: Secondary | ICD-10-CM

## 2017-12-09 ENCOUNTER — Ambulatory Visit
Admission: RE | Admit: 2017-12-09 | Discharge: 2017-12-09 | Disposition: A | Discharge status: Home / Self Care | Payer: BLUE CROSS/BLUE SHIELD | Source: Ambulatory Visit | Attending: Family Medicine | Admitting: Family Medicine

## 2017-12-09 ENCOUNTER — Ambulatory Visit: Payer: BLUE CROSS/BLUE SHIELD

## 2017-12-09 DIAGNOSIS — R922 Inconclusive mammogram: Secondary | ICD-10-CM | POA: Diagnosis not present

## 2017-12-09 DIAGNOSIS — R928 Other abnormal and inconclusive findings on diagnostic imaging of breast: Secondary | ICD-10-CM

## 2017-12-29 DIAGNOSIS — A0811 Acute gastroenteropathy due to Norwalk agent: Secondary | ICD-10-CM | POA: Diagnosis not present

## 2018-02-10 DIAGNOSIS — I10 Essential (primary) hypertension: Secondary | ICD-10-CM | POA: Diagnosis not present

## 2018-02-10 DIAGNOSIS — E785 Hyperlipidemia, unspecified: Secondary | ICD-10-CM | POA: Diagnosis not present

## 2018-02-10 DIAGNOSIS — Z1211 Encounter for screening for malignant neoplasm of colon: Secondary | ICD-10-CM | POA: Diagnosis not present

## 2018-02-10 DIAGNOSIS — D509 Iron deficiency anemia, unspecified: Secondary | ICD-10-CM | POA: Diagnosis not present

## 2018-02-23 DIAGNOSIS — R945 Abnormal results of liver function studies: Secondary | ICD-10-CM | POA: Diagnosis not present

## 2018-07-19 DIAGNOSIS — Z20828 Contact with and (suspected) exposure to other viral communicable diseases: Secondary | ICD-10-CM | POA: Diagnosis not present

## 2018-08-15 DIAGNOSIS — I10 Essential (primary) hypertension: Secondary | ICD-10-CM | POA: Diagnosis not present

## 2018-08-15 DIAGNOSIS — E785 Hyperlipidemia, unspecified: Secondary | ICD-10-CM | POA: Diagnosis not present

## 2018-08-28 DIAGNOSIS — H40033 Anatomical narrow angle, bilateral: Secondary | ICD-10-CM | POA: Diagnosis not present

## 2018-08-28 DIAGNOSIS — H2513 Age-related nuclear cataract, bilateral: Secondary | ICD-10-CM | POA: Diagnosis not present

## 2018-10-17 DIAGNOSIS — E785 Hyperlipidemia, unspecified: Secondary | ICD-10-CM | POA: Diagnosis not present

## 2018-10-17 DIAGNOSIS — L918 Other hypertrophic disorders of the skin: Secondary | ICD-10-CM | POA: Diagnosis not present

## 2018-10-17 DIAGNOSIS — Z0000 Encounter for general adult medical examination without abnormal findings: Secondary | ICD-10-CM | POA: Diagnosis not present

## 2018-10-17 DIAGNOSIS — D509 Iron deficiency anemia, unspecified: Secondary | ICD-10-CM | POA: Diagnosis not present

## 2018-10-17 DIAGNOSIS — I10 Essential (primary) hypertension: Secondary | ICD-10-CM | POA: Diagnosis not present

## 2018-11-18 DIAGNOSIS — Z20828 Contact with and (suspected) exposure to other viral communicable diseases: Secondary | ICD-10-CM | POA: Diagnosis not present

## 2019-04-17 DIAGNOSIS — L918 Other hypertrophic disorders of the skin: Secondary | ICD-10-CM | POA: Diagnosis not present

## 2019-04-17 DIAGNOSIS — R7309 Other abnormal glucose: Secondary | ICD-10-CM | POA: Diagnosis not present

## 2019-04-17 DIAGNOSIS — I10 Essential (primary) hypertension: Secondary | ICD-10-CM | POA: Diagnosis not present

## 2019-04-17 DIAGNOSIS — D509 Iron deficiency anemia, unspecified: Secondary | ICD-10-CM | POA: Diagnosis not present

## 2019-04-17 DIAGNOSIS — E785 Hyperlipidemia, unspecified: Secondary | ICD-10-CM | POA: Diagnosis not present

## 2019-08-02 DIAGNOSIS — R109 Unspecified abdominal pain: Secondary | ICD-10-CM | POA: Diagnosis not present

## 2019-08-02 DIAGNOSIS — R350 Frequency of micturition: Secondary | ICD-10-CM | POA: Diagnosis not present

## 2019-08-02 DIAGNOSIS — N200 Calculus of kidney: Secondary | ICD-10-CM | POA: Diagnosis not present

## 2019-08-10 DIAGNOSIS — K649 Unspecified hemorrhoids: Secondary | ICD-10-CM | POA: Diagnosis not present

## 2019-08-10 DIAGNOSIS — K921 Melena: Secondary | ICD-10-CM | POA: Diagnosis not present

## 2019-09-03 DIAGNOSIS — H40033 Anatomical narrow angle, bilateral: Secondary | ICD-10-CM | POA: Diagnosis not present

## 2019-09-03 DIAGNOSIS — H2513 Age-related nuclear cataract, bilateral: Secondary | ICD-10-CM | POA: Diagnosis not present

## 2019-10-09 DIAGNOSIS — K625 Hemorrhage of anus and rectum: Secondary | ICD-10-CM | POA: Diagnosis not present

## 2019-10-09 DIAGNOSIS — Z1211 Encounter for screening for malignant neoplasm of colon: Secondary | ICD-10-CM | POA: Diagnosis not present

## 2019-10-26 DIAGNOSIS — E785 Hyperlipidemia, unspecified: Secondary | ICD-10-CM | POA: Diagnosis not present

## 2019-10-26 DIAGNOSIS — I10 Essential (primary) hypertension: Secondary | ICD-10-CM | POA: Diagnosis not present

## 2019-10-26 DIAGNOSIS — E1169 Type 2 diabetes mellitus with other specified complication: Secondary | ICD-10-CM | POA: Diagnosis not present

## 2019-10-26 DIAGNOSIS — Z0000 Encounter for general adult medical examination without abnormal findings: Secondary | ICD-10-CM | POA: Diagnosis not present

## 2019-12-03 DIAGNOSIS — Z1159 Encounter for screening for other viral diseases: Secondary | ICD-10-CM | POA: Diagnosis not present

## 2019-12-06 DIAGNOSIS — K6289 Other specified diseases of anus and rectum: Secondary | ICD-10-CM | POA: Diagnosis not present

## 2019-12-06 DIAGNOSIS — D125 Benign neoplasm of sigmoid colon: Secondary | ICD-10-CM | POA: Diagnosis not present

## 2019-12-06 DIAGNOSIS — K635 Polyp of colon: Secondary | ICD-10-CM | POA: Diagnosis not present

## 2019-12-06 DIAGNOSIS — K644 Residual hemorrhoidal skin tags: Secondary | ICD-10-CM | POA: Diagnosis not present

## 2019-12-06 DIAGNOSIS — D124 Benign neoplasm of descending colon: Secondary | ICD-10-CM | POA: Diagnosis not present

## 2019-12-06 DIAGNOSIS — K5289 Other specified noninfective gastroenteritis and colitis: Secondary | ICD-10-CM | POA: Diagnosis not present

## 2019-12-06 DIAGNOSIS — Z1211 Encounter for screening for malignant neoplasm of colon: Secondary | ICD-10-CM | POA: Diagnosis not present

## 2019-12-06 DIAGNOSIS — K648 Other hemorrhoids: Secondary | ICD-10-CM | POA: Diagnosis not present

## 2020-01-25 DIAGNOSIS — M79652 Pain in left thigh: Secondary | ICD-10-CM | POA: Diagnosis not present

## 2020-01-25 DIAGNOSIS — M79662 Pain in left lower leg: Secondary | ICD-10-CM | POA: Diagnosis not present

## 2020-02-28 ENCOUNTER — Other Ambulatory Visit: Payer: Self-pay | Admitting: Family Medicine

## 2020-02-28 DIAGNOSIS — Z1231 Encounter for screening mammogram for malignant neoplasm of breast: Secondary | ICD-10-CM

## 2020-02-28 DIAGNOSIS — Z86010 Personal history of colonic polyps: Secondary | ICD-10-CM | POA: Diagnosis not present

## 2020-02-28 DIAGNOSIS — K5120 Ulcerative (chronic) proctitis without complications: Secondary | ICD-10-CM | POA: Diagnosis not present

## 2020-04-18 ENCOUNTER — Other Ambulatory Visit: Payer: Self-pay

## 2020-04-18 ENCOUNTER — Ambulatory Visit
Admission: RE | Admit: 2020-04-18 | Discharge: 2020-04-18 | Disposition: A | Discharge status: Home / Self Care | Payer: BC Managed Care – PPO | Source: Ambulatory Visit | Attending: Family Medicine | Admitting: Family Medicine

## 2020-04-18 DIAGNOSIS — Z1231 Encounter for screening mammogram for malignant neoplasm of breast: Secondary | ICD-10-CM

## 2020-04-25 DIAGNOSIS — E1169 Type 2 diabetes mellitus with other specified complication: Secondary | ICD-10-CM | POA: Diagnosis not present

## 2020-04-25 DIAGNOSIS — Z7984 Long term (current) use of oral hypoglycemic drugs: Secondary | ICD-10-CM | POA: Diagnosis not present

## 2020-04-25 DIAGNOSIS — D509 Iron deficiency anemia, unspecified: Secondary | ICD-10-CM | POA: Diagnosis not present

## 2020-04-25 DIAGNOSIS — E785 Hyperlipidemia, unspecified: Secondary | ICD-10-CM | POA: Diagnosis not present

## 2020-04-25 DIAGNOSIS — I10 Essential (primary) hypertension: Secondary | ICD-10-CM | POA: Diagnosis not present

## 2020-06-17 ENCOUNTER — Encounter (HOSPITAL_COMMUNITY): Payer: Self-pay | Admitting: Emergency Medicine

## 2020-06-17 ENCOUNTER — Ambulatory Visit (HOSPITAL_COMMUNITY)
Admission: EM | Admit: 2020-06-17 | Discharge: 2020-06-17 | Disposition: A | Discharge status: Home / Self Care | Payer: BC Managed Care – PPO | Attending: Student | Admitting: Student

## 2020-06-17 ENCOUNTER — Other Ambulatory Visit: Payer: Self-pay

## 2020-06-17 DIAGNOSIS — B349 Viral infection, unspecified: Secondary | ICD-10-CM | POA: Diagnosis not present

## 2020-06-17 DIAGNOSIS — Z1152 Encounter for screening for COVID-19: Secondary | ICD-10-CM | POA: Diagnosis not present

## 2020-06-17 HISTORY — DX: Pure hypercholesterolemia, unspecified: E78.00

## 2020-06-17 HISTORY — DX: Essential (primary) hypertension: I10

## 2020-06-17 LAB — POC INFLUENZA A AND B ANTIGEN (URGENT CARE ONLY)
INFLUENZA A ANTIGEN, POC: NEGATIVE
INFLUENZA B ANTIGEN, POC: NEGATIVE

## 2020-06-17 NOTE — ED Triage Notes (Signed)
Patient presents to [facility] for evaluation after waking up this mroning to fever (states 102), body aches.  Denies cough, sore throat, abdominal pain, n/v/d.

## 2020-06-17 NOTE — Discharge Instructions (Addendum)
-  Drink plenty of fluids, get plenty of rest -For fevers/chills, bodyaches, headaches- Take Tylenol 1000 mg 3 times daily, and ibuprofen 800 mg 3 times daily with food.  You can take these together, or alternate every 3-4 hours. -With a virus, you're typically contagious for 5-7 days, or as long as you're having fevers.

## 2020-06-17 NOTE — ED Provider Notes (Signed)
[place]    CSN: [id number] Arrival date & time: 06/17/20  1329      History   Chief Complaint Chief Complaint  Patient presents with   Fever    HPI [name] [name] is a 55 y.o. female presenting with febrile illness.  Medical history nephrolithiasis, fibroids, hyperlipidemia, anemia.  Notes waking up this morning with a fever as high as 102, generalized body aches. Last dose of tylenol was 4 hours ago. Generalized body aches. Generalized crampy abd pain. Chest wall pain with deep inspiration. Denies cough, sore throat, abdominal pain, n/v/d/c. Denies pulmonary disease.  HPI  Past Medical History:  Diagnosis Date   Anemia    Hypercholesteremia    Hypertension     Patient Active Problem List   Diagnosis Date Noted   Nephrolithiasis 03/25/2013   Fibroids 10/27/2011   Hypercholesteremia 10/07/2011    Past Surgical History:  Procedure Laterality Date   CESAREAN SECTION     2 times     OB History     Gravida  5   Para  5   Term      Preterm      AB      Living  6      SAB      IAB      Ectopic      Multiple  1   Live Births  6            Home Medications    Prior to Admission medications   Medication Sig Start Date End Date Taking? Authorizing Provider  ibuprofen (ADVIL,MOTRIN) 800 MG tablet Take 1 tablet (800 mg total) by mouth 3 (three) times daily. 12/20/12   Tanna Furry, MD  ondansetron (ZOFRAN ODT) 4 MG disintegrating tablet 4mg  ODT q4 hours prn nausea/vomit 03/25/13   Pamella Pert, MD  oxyCODONE-acetaminophen (PERCOCET) 5-325 MG per tablet Take 1-2 tablets by mouth every 6 (six) hours as needed for moderate pain. 03/25/13   Pamella Pert, MD  rosuvastatin (CRESTOR) 20 MG tablet Take 20 mg by mouth daily.    [provider]  tamsulosin (FLOMAX) 0.4 MG CAPS capsule Take 1 capsule (0.4 mg total) by mouth daily. 03/25/13   Pamella Pert, MD    Family History Family History  Problem Relation Age of Onset    Diabetes Mother    Asthma Son    Cancer Brother 68       blood cancer    Social History Social History   Tobacco Use   Smoking status: Never   Smokeless tobacco: Never  Substance Use Topics   Alcohol use: No   Drug use: No     Allergies   Patient has no known allergies.   Review of Systems Review of Systems  Constitutional:  Positive for chills, fatigue and fever. Negative for appetite change.  HENT:  Positive for congestion. Negative for ear pain, rhinorrhea, sinus pressure, sinus pain and sore throat.   Eyes:  Negative for redness and visual disturbance.  Respiratory:  Negative for cough, chest tightness, shortness of breath and wheezing.   Cardiovascular:  Negative for chest pain and palpitations.  Gastrointestinal:  Negative for abdominal pain, constipation, diarrhea, nausea and vomiting.  Genitourinary:  Negative for dysuria, frequency and urgency.  Musculoskeletal:  Positive for myalgias.  Neurological:  Negative for dizziness, weakness and headaches.  Psychiatric/Behavioral:  Negative for confusion.   All other systems reviewed and are negative.   Physical Exam Triage Vital Signs ED Triage Vitals  Enc Vitals Group     BP 06/17/20 1456 (!) 147/73     Pulse Rate 06/17/20 1456 72     Resp 06/17/20 1456 18     Temp 06/17/20 1456 100 F (37.8 C)     Temp Source 06/17/20 1456 Oral     SpO2 06/17/20 1456 99 %     Weight --      Height --      Head Circumference --      Peak Flow --      Pain Score 06/17/20 1453 1     Pain Loc --      Pain Edu? --      Excl. in [place]? --    No data found.  Updated Vital Signs BP (!) 147/73 (BP Location: Right Arm)   Pulse 72   Temp 100 F (37.8 C) (Oral) Comment: Tylenol on board  Resp 18   LMP 09/09/2013   SpO2 99%   Visual Acuity Right Eye Distance:   Left Eye Distance:   Bilateral Distance:    Right Eye Near:   Left Eye Near:    Bilateral Near:     Physical Exam Vitals reviewed.  Constitutional:       General: She is not in acute distress.    Appearance: Normal appearance. She is ill-appearing. She is not diaphoretic.  HENT:     Head: Normocephalic and atraumatic.     Right Ear: Hearing, tympanic membrane, ear canal and external ear normal. No swelling or tenderness. There is no impacted cerumen. No mastoid tenderness. Tympanic membrane is not perforated, erythematous, retracted or bulging.     Left Ear: Hearing, tympanic membrane, ear canal and external ear normal. No swelling or tenderness. There is no impacted cerumen. No mastoid tenderness. Tympanic membrane is not perforated, erythematous, retracted or bulging.     Nose:     Right Sinus: No maxillary sinus tenderness or frontal sinus tenderness.     Left Sinus: No maxillary sinus tenderness or frontal sinus tenderness.     Mouth/Throat:     Mouth: Mucous membranes are moist.     Pharynx: Uvula midline. No oropharyngeal exudate or posterior oropharyngeal erythema.     Tonsils: No tonsillar exudate.  Eyes:     Extraocular Movements: Extraocular movements intact.     Pupils: Pupils are equal, round, and reactive to light.  Cardiovascular:     Rate and Rhythm: Normal rate and regular rhythm.     Pulses:          Radial pulses are 2+ on the right side and 2+ on the left side.     Heart sounds: Normal heart sounds.  Pulmonary:     Effort: Pulmonary effort is normal.     Breath sounds: Normal breath sounds and air entry. No wheezing, rhonchi or rales.  Chest:     Chest wall: Tenderness present.     Comments: TTP midsternum Abdominal:     General: Abdomen is flat. Bowel sounds are normal.     Palpations: Abdomen is soft.     Tenderness: There is no abdominal tenderness. There is no guarding or rebound.  Musculoskeletal:     Right lower leg: No edema.     Left lower leg: No edema.  Lymphadenopathy:     Cervical: No cervical adenopathy.  Skin:    General: Skin is warm.     Capillary Refill: Capillary refill takes less than 2  seconds.  Neurological:  General: No focal deficit present.     Mental Status: She is alert and oriented to person, place, and time.  Psychiatric:        Attention and Perception: Attention and perception normal.        Mood and Affect: Mood and affect normal.        Behavior: Behavior normal. Behavior is cooperative.        Thought Content: Thought content normal.        Judgment: Judgment normal.     UC Treatments / Results  Labs (all labs ordered are listed, but only abnormal results are displayed) Labs Reviewed  SARS CORONAVIRUS 2 (TAT 6-24 HRS)  POC INFLUENZA A AND B ANTIGEN (URGENT CARE ONLY)    EKG   Radiology No results found.  Procedures Procedures (including critical care time)  Medications Ordered in UC Medications - No data to display  Initial Impression / Assessment and Plan / UC Course  I have reviewed the triage vital signs and the nursing notes.  Pertinent labs & imaging results that were available during my care of the patient were reviewed by me and considered in my medical decision making (see chart for details).     This patient is a 55 year old female presenting with viral illness.  Borderline febrile, nontachycardic, nontachypneic, oxygenating well on room air.  Last dose of antipyretic was 4 hours ago.  Rapid influenza negative.  Covid PCR sent.  Symptomatic relief as below.  ED return precautions discussed. Patient verbalizes understanding and agreement.    Final Clinical Impressions(s) / UC Diagnoses   Final diagnoses:  Viral illness  Encounter for screening for COVID-19     Discharge Instructions      -Drink plenty of fluids, get plenty of rest -For fevers/chills, bodyaches, headaches- Take Tylenol 1000 mg 3 times daily, and ibuprofen 800 mg 3 times daily with food.  You can take these together, or alternate every 3-4 hours. -With a virus, you're typically contagious for 5-7 days, or as long as you're having fevers.        ED Prescriptions   None    PDMP not reviewed this encounter.   Hazel Sams, PA-C 06/17/20 [phone]

## 2020-06-18 LAB — SARS CORONAVIRUS 2 (TAT 6-24 HRS): SARS Coronavirus 2: NEGATIVE

## 2020-06-22 DIAGNOSIS — K921 Melena: Secondary | ICD-10-CM | POA: Diagnosis not present

## 2020-06-22 DIAGNOSIS — R112 Nausea with vomiting, unspecified: Secondary | ICD-10-CM | POA: Diagnosis not present

## 2020-06-22 DIAGNOSIS — R197 Diarrhea, unspecified: Secondary | ICD-10-CM | POA: Diagnosis not present

## 2020-06-22 DIAGNOSIS — R109 Unspecified abdominal pain: Secondary | ICD-10-CM | POA: Diagnosis not present

## 2020-08-31 ENCOUNTER — Encounter (HOSPITAL_COMMUNITY): Payer: Self-pay

## 2020-08-31 ENCOUNTER — Emergency Department (HOSPITAL_COMMUNITY)
Admission: EM | Admit: 2020-08-31 | Discharge: 2020-08-31 | Disposition: A | Discharge status: Home / Self Care | Payer: BC Managed Care – PPO | Attending: Emergency Medicine | Admitting: Emergency Medicine

## 2020-08-31 ENCOUNTER — Emergency Department (HOSPITAL_COMMUNITY): Payer: BC Managed Care – PPO

## 2020-08-31 ENCOUNTER — Other Ambulatory Visit: Payer: Self-pay

## 2020-08-31 DIAGNOSIS — I10 Essential (primary) hypertension: Secondary | ICD-10-CM | POA: Insufficient documentation

## 2020-08-31 DIAGNOSIS — R109 Unspecified abdominal pain: Secondary | ICD-10-CM | POA: Diagnosis not present

## 2020-08-31 DIAGNOSIS — R197 Diarrhea, unspecified: Secondary | ICD-10-CM | POA: Diagnosis not present

## 2020-08-31 DIAGNOSIS — R1111 Vomiting without nausea: Secondary | ICD-10-CM

## 2020-08-31 DIAGNOSIS — E119 Type 2 diabetes mellitus without complications: Secondary | ICD-10-CM | POA: Diagnosis not present

## 2020-08-31 DIAGNOSIS — R1084 Generalized abdominal pain: Secondary | ICD-10-CM | POA: Diagnosis not present

## 2020-08-31 DIAGNOSIS — R112 Nausea with vomiting, unspecified: Secondary | ICD-10-CM | POA: Diagnosis not present

## 2020-08-31 DIAGNOSIS — U071 COVID-19: Secondary | ICD-10-CM | POA: Insufficient documentation

## 2020-08-31 HISTORY — DX: Type 2 diabetes mellitus without complications: E11.9

## 2020-08-31 LAB — RESP PANEL BY RT-PCR (FLU A&B, COVID) ARPGX2
Influenza A by PCR: NEGATIVE
Influenza B by PCR: NEGATIVE
SARS Coronavirus 2 by RT PCR: POSITIVE — AB

## 2020-08-31 LAB — CBC WITH DIFFERENTIAL/PLATELET
Abs Immature Granulocytes: 0.02 10*3/uL (ref 0.00–0.07)
Basophils Absolute: 0 10*3/uL (ref 0.0–0.1)
Basophils Relative: 1 %
Eosinophils Absolute: 0 10*3/uL (ref 0.0–0.5)
Eosinophils Relative: 0 %
HCT: 47.9 % — ABNORMAL HIGH (ref 36.0–46.0)
Hemoglobin: 15.7 g/dL — ABNORMAL HIGH (ref 12.0–15.0)
Immature Granulocytes: 0 %
Lymphocytes Relative: 16 %
Lymphs Abs: 1.1 10*3/uL (ref 0.7–4.0)
MCH: 28.7 pg (ref 26.0–34.0)
MCHC: 32.8 g/dL (ref 30.0–36.0)
MCV: 87.6 fL (ref 80.0–100.0)
Monocytes Absolute: 0.6 10*3/uL (ref 0.1–1.0)
Monocytes Relative: 9 %
Neutro Abs: 5.1 10*3/uL (ref 1.7–7.7)
Neutrophils Relative %: 74 %
Platelets: 253 10*3/uL (ref 150–400)
RBC: 5.47 MIL/uL — ABNORMAL HIGH (ref 3.87–5.11)
RDW: 13.5 % (ref 11.5–15.5)
WBC: 6.8 10*3/uL (ref 4.0–10.5)
nRBC: 0 % (ref 0.0–0.2)

## 2020-08-31 LAB — URINALYSIS, ROUTINE W REFLEX MICROSCOPIC
Bacteria, UA: NONE SEEN
Bilirubin Urine: NEGATIVE
Glucose, UA: NEGATIVE mg/dL
Ketones, ur: 5 mg/dL — AB
Leukocytes,Ua: NEGATIVE
Nitrite: NEGATIVE
Protein, ur: 100 mg/dL — AB
Specific Gravity, Urine: 1.027 (ref 1.005–1.030)
pH: 5 (ref 5.0–8.0)

## 2020-08-31 LAB — COMPREHENSIVE METABOLIC PANEL
ALT: 42 U/L (ref 0–44)
AST: 34 U/L (ref 15–41)
Albumin: 4.2 g/dL (ref 3.5–5.0)
Alkaline Phosphatase: 51 U/L (ref 38–126)
Anion gap: 11 (ref 5–15)
BUN: 16 mg/dL (ref 6–20)
CO2: 27 mmol/L (ref 22–32)
Calcium: 9.5 mg/dL (ref 8.9–10.3)
Chloride: 99 mmol/L (ref 98–111)
Creatinine, Ser: 1.22 mg/dL — ABNORMAL HIGH (ref 0.44–1.00)
GFR, Estimated: 52 mL/min — ABNORMAL LOW (ref >60)
Glucose, Bld: 149 mg/dL — ABNORMAL HIGH (ref 70–99)
Potassium: 3.8 mmol/L (ref 3.5–5.1)
Sodium: 137 mmol/L (ref 135–145)
Total Bilirubin: 1.1 mg/dL (ref 0.3–1.2)
Total Protein: 8.2 g/dL — ABNORMAL HIGH (ref 6.5–8.1)

## 2020-08-31 LAB — LIPASE, BLOOD: Lipase: 24 U/L (ref 11–51)

## 2020-08-31 MED ORDER — ONDANSETRON HCL 4 MG/2ML IJ SOLN
4.0000 mg | Freq: Once | INTRAMUSCULAR | Status: AC
  Administered 2020-08-31: 4 mg via INTRAVENOUS
  Filled 2020-08-31: qty 2

## 2020-08-31 MED ORDER — IOHEXOL 350 MG/ML SOLN
80.0000 mL | Freq: Once | INTRAVENOUS | Status: AC | PRN
  Administered 2020-08-31: 80 mL via INTRAVENOUS

## 2020-08-31 MED ORDER — ONDANSETRON HCL 4 MG PO TABS: 4.0000 mg | ORAL_TABLET | Freq: Four times a day (QID) | ORAL | 0 refills | Status: AC

## 2020-08-31 MED ORDER — SODIUM CHLORIDE 0.9 % IV BOLUS
1000.0000 mL | Freq: Once | INTRAVENOUS | Status: AC
  Administered 2020-08-31: 1000 mL via INTRAVENOUS

## 2020-08-31 MED ORDER — NIRMATRELVIR/RITONAVIR (PAXLOVID) TABLET (RENAL DOSING): 2.0000 | ORAL_TABLET | Freq: Two times a day (BID) | ORAL | 0 refills | Status: AC

## 2020-08-31 NOTE — ED Notes (Signed)
CRITICAL VALUE STICKER  CRITICAL VALUE: Covid positive  RECEIVER (on-site recipient of call):I. Chee Dimon  DATE & TIME NOTIFIED:  08/28 0914 MESSENGER (representative from lab):Lab  MD NOTIFIED:[place]  TIME OF NOTIFICATION:08/31/20 [id number]

## 2020-08-31 NOTE — ED Provider Notes (Signed)
[facility] DEPT Provider Note   CSN: [id number] Arrival date & time: 08/31/20  [id number]     History Chief Complaint  Patient presents with   Abdominal Pain    [name] [name] is a 55 y.o. female.  The history is provided by the patient.  Abdominal Pain Pain location:  Generalized Pain quality: aching and bloating   Pain radiates to:  Does not radiate Pain severity:  Mild Onset quality:  Gradual Duration:  12 hours Timing:  Constant Progression:  Unchanged Chronicity:  New Context comment:  N/V/D overnight, denies sick contacts or suspicious food. Thought she had a fever Relieved by:  Acetaminophen Worsened by:  Nothing Associated symptoms: diarrhea, nausea and vomiting   Associated symptoms: no anorexia, no chest pain, no chills, no cough, no dysuria, no fever, no hematuria, no shortness of breath and no sore throat   Risk factors: has not had multiple surgeries       Past Medical History:  Diagnosis Date   Anemia    Diabetes mellitus without complication ([place])    Hypercholesteremia    Hypertension     Patient Active Problem List   Diagnosis Date Noted   Nephrolithiasis 03/25/2013   Fibroids 10/27/2011   Hypercholesteremia 10/07/2011    Past Surgical History:  Procedure Laterality Date   CESAREAN SECTION     2 times      OB History     Gravida  5   Para  5   Term      Preterm      AB      Living  6      SAB      IAB      Ectopic      Multiple  1   Live Births  6           Family History  Problem Relation Age of Onset   Diabetes Mother    Asthma Son    Cancer Brother 27       blood cancer    Social History   Tobacco Use   Smoking status: Never   Smokeless tobacco: Never  Substance Use Topics   Alcohol use: No   Drug use: No    Home Medications Prior to Admission medications   Medication Sig Start Date End Date Taking? Authorizing Provider  nirmatrelvir/ritonavir EUA, renal dosing,  (PAXLOVID) 10 x 150 MG & 10 x '100MG'$  TABS Take 2 tablets by mouth 2 (two) times daily for 5 days. Patient GFR is 52. Take nirmatrelvir (150 mg) one tablet twice daily for 5 days and ritonavir (100 mg) one tablet twice daily for 5 days. 08/31/20 09/05/20 Yes Bhavana Kady, DO  ondansetron (ZOFRAN) 4 MG tablet Take 1 tablet (4 mg total) by mouth every 6 (six) hours. 08/31/20  Yes Rashawn Rolon, DO  ibuprofen (ADVIL,MOTRIN) 800 MG tablet Take 1 tablet (800 mg total) by mouth 3 (three) times daily. 12/20/12   Tanna Furry, MD  ondansetron (ZOFRAN ODT) 4 MG disintegrating tablet '4mg'$  ODT q4 hours prn nausea/vomit 03/25/13   Pamella Pert, MD  oxyCODONE-acetaminophen (PERCOCET) 5-325 MG per tablet Take 1-2 tablets by mouth every 6 (six) hours as needed for moderate pain. 03/25/13   Pamella Pert, MD  rosuvastatin (CRESTOR) 20 MG tablet Take 20 mg by mouth daily.    [provider]  tamsulosin (FLOMAX) 0.4 MG CAPS capsule Take 1 capsule (0.4 mg total) by mouth daily. 03/25/13   Pamella Pert, MD  Allergies    Patient has no known allergies.  Review of Systems   Review of Systems  Constitutional:  Negative for chills and fever.  HENT:  Negative for ear pain and sore throat.   Eyes:  Negative for pain and visual disturbance.  Respiratory:  Negative for cough and shortness of breath.   Cardiovascular:  Negative for chest pain and palpitations.  Gastrointestinal:  Positive for abdominal pain, diarrhea, nausea and vomiting. Negative for anorexia.  Genitourinary:  Negative for dysuria and hematuria.  Musculoskeletal:  Negative for arthralgias and back pain.  Skin:  Negative for color change and rash.  Neurological:  Negative for seizures and syncope.  All other systems reviewed and are negative.  Physical Exam Updated Vital Signs BP 112/72   Pulse 76   Temp 99.3 F (37.4 C) (Oral)   Resp 19   LMP 09/09/2013   SpO2 94%   Physical Exam Vitals and nursing note reviewed.   Constitutional:      General: She is not in acute distress.    Appearance: She is well-developed. She is not ill-appearing.  HENT:     Head: Normocephalic and atraumatic.  Eyes:     Extraocular Movements: Extraocular movements intact.     Conjunctiva/sclera: Conjunctivae normal.     Pupils: Pupils are equal, round, and reactive to light.  Cardiovascular:     Rate and Rhythm: Normal rate and regular rhythm.     Heart sounds: Normal heart sounds. No murmur heard. Pulmonary:     Effort: Pulmonary effort is normal. No respiratory distress.     Breath sounds: Normal breath sounds.  Abdominal:     General: Abdomen is flat. There is no distension.     Palpations: Abdomen is soft.     Tenderness: There is generalized abdominal tenderness and tenderness in the epigastric area. There is no guarding or rebound.     Hernia: No hernia is present.  Musculoskeletal:     Cervical back: Neck supple.  Skin:    General: Skin is warm and dry.     Capillary Refill: Capillary refill takes less than 2 seconds.  Neurological:     General: No focal deficit present.     Mental Status: She is alert.  Psychiatric:        Mood and Affect: Mood normal.    ED Results / Procedures / Treatments   Labs (all labs ordered are listed, but only abnormal results are displayed) Labs Reviewed  RESP PANEL BY RT-PCR (FLU A&B, COVID) ARPGX2 - Abnormal; Notable for the following components:      Result Value   SARS Coronavirus 2 by RT PCR POSITIVE (*)    All other components within normal limits  CBC WITH DIFFERENTIAL/PLATELET - Abnormal; Notable for the following components:   RBC 5.47 (*)    Hemoglobin 15.7 (*)    HCT 47.9 (*)    All other components within normal limits  COMPREHENSIVE METABOLIC PANEL - Abnormal; Notable for the following components:   Glucose, Bld 149 (*)    Creatinine, Ser 1.22 (*)    Total Protein 8.2 (*)    GFR, Estimated 52 (*)    All other components within normal limits  URINALYSIS,  ROUTINE W REFLEX MICROSCOPIC - Abnormal; Notable for the following components:   APPearance HAZY (*)    Hgb urine dipstick SMALL (*)    Ketones, ur 5 (*)    Protein, ur 100 (*)    All other components within normal limits  LIPASE, BLOOD    EKG None  Radiology CT ABDOMEN PELVIS W CONTRAST  Result Date: 08/31/2020 CLINICAL DATA:  Abdominal pain.  COVID positive. EXAM: CT ABDOMEN AND PELVIS WITH CONTRAST TECHNIQUE: Multidetector CT imaging of the abdomen and pelvis was performed using the standard protocol following bolus administration of intravenous contrast. CONTRAST:  87m OMNIPAQUE IOHEXOL 350 MG/ML SOLN COMPARISON:  CT abdomen dated 03/28/2013. FINDINGS: Lower chest: No acute abnormality. Hepatobiliary: No focal liver abnormality is seen. Gallbladder appears normal. No bile duct dilatation is seen. Pancreas: Unremarkable. No pancreatic ductal dilatation or surrounding inflammatory changes. Spleen: Normal in size without focal abnormality. Adrenals/Urinary Tract: Adrenal glands appear normal. Small renal cysts bilaterally. Kidneys are otherwise unremarkable without suspicious mass, stone or hydronephrosis. No ureteral or bladder calculi are identified. Bladder is unremarkable, partially decompressed. Stomach/Bowel: No dilated large or small bowel loops. No evidence of bowel wall inflammation. Appendix is normal. Stomach is unremarkable, partially decompressed. Vascular/Lymphatic: No vascular abnormality is seen. No enlarged lymph nodes are seen. Reproductive: The uterus is enlarged, suggesting multiple fibroids. Adnexal regions are unremarkable. Other: No free fluid or abscess collection. No free intraperitoneal air. Musculoskeletal: No acute-appearing osseous abnormality. Mild degenerative spondylosis of the slightly scoliotic thoracolumbar spine. Degenerative spurring at each hip joint. IMPRESSION: 1. No acute findings within the abdomen or pelvis. No bowel obstruction or evidence of bowel wall  inflammation. No evidence of acute solid organ abnormality. No renal or ureteral calculi. Appendix is normal. 2. Enlarged uterus, suggesting multiple fibroids. Consider nonemergent pelvic ultrasound for confirmation. Electronically Signed   By: SFranki CabotM.D.   On: 08/31/2020 10:02    Procedures Procedures   Medications Ordered in ED Medications  ondansetron (ZOFRAN) injection 4 mg (4 mg Intravenous Given 08/31/20 0736)  sodium chloride 0.9 % bolus 1,000 mL (1,000 mLs Intravenous New Bag/Given 08/31/20 0737)  iohexol (OMNIPAQUE) 350 MG/ML injection 80 mL (80 mLs Intravenous Contrast Given 08/31/20 0932)    ED Course  I have reviewed the triage vital signs and the nursing notes.  Pertinent labs & imaging results that were available during my care of the patient were reviewed by me and considered in my medical decision making (see chart for details).    MDM Rules/Calculators/A&P                           10:30 AM  TEustace Penis here with abdominal pain.  Normal vitals.  No fever.  Nausea, vomiting, diarrhea overnight.  Denies any suspicious food intake or sick contacts.  Diffuse abdominal tenderness but worse in the epigastric region.  Denies history of surgery.  Symptoms started overnight.  Overall suspect foodborne illness or viral process.  Could be a colitis.  Seems less likely to be pancreatitis or cholecystitis.  Will check basic lab work including CT scan abdomen pelvis to further evaluate.  Will give fluid bolus and IV Zofran and reevaluate.  10:30 AM patient positive for COVID.  Otherwise lab work and imaging is unremarkable.  Believe symptoms are secondary to COVID.  No respiratory symptoms.  Overall feeling better after nausea medicine and IV fluids.  Will prescribe pack Slo-Bid and Zofran.  She is vaccinated.  She understands return precautions.  Discharged in good condition.  This chart was dictated using voice recognition software.  Despite best efforts to proofread,  errors can  occur which can change the documentation meaning.   Final Clinical Impression(s) / ED Diagnoses Final diagnoses:  [id number]  Vomiting without nausea, intractability of vomiting not specified, unspecified vomiting type    Rx / DC Orders ED Discharge Orders          Ordered    nirmatrelvir/ritonavir EUA, renal dosing, (PAXLOVID) 10 x 150 MG & 10 x '100MG'$  TABS  2 times daily        08/31/20 1029    ondansetron (ZOFRAN) 4 MG tablet  Every 6 hours        08/31/20 [place], Asif Muchow, DO 08/31/20 1031

## 2020-08-31 NOTE — ED Triage Notes (Signed)
Pt reports abdominal pain, diarrhea, and low grade fever that began last night. Denies N/V and blood in stool.

## 2020-09-03 DIAGNOSIS — H2513 Age-related nuclear cataract, bilateral: Secondary | ICD-10-CM | POA: Diagnosis not present

## 2020-09-03 DIAGNOSIS — H40033 Anatomical narrow angle, bilateral: Secondary | ICD-10-CM | POA: Diagnosis not present

## 2020-09-05 DIAGNOSIS — U071 COVID-19: Secondary | ICD-10-CM | POA: Diagnosis not present

## 2020-09-05 DIAGNOSIS — R112 Nausea with vomiting, unspecified: Secondary | ICD-10-CM | POA: Diagnosis not present

## 2020-09-05 DIAGNOSIS — R109 Unspecified abdominal pain: Secondary | ICD-10-CM | POA: Diagnosis not present

## 2020-11-07 DIAGNOSIS — E785 Hyperlipidemia, unspecified: Secondary | ICD-10-CM | POA: Diagnosis not present

## 2020-11-07 DIAGNOSIS — Z7984 Long term (current) use of oral hypoglycemic drugs: Secondary | ICD-10-CM | POA: Diagnosis not present

## 2020-11-07 DIAGNOSIS — Z0000 Encounter for general adult medical examination without abnormal findings: Secondary | ICD-10-CM | POA: Diagnosis not present

## 2020-11-07 DIAGNOSIS — M545 Low back pain, unspecified: Secondary | ICD-10-CM | POA: Diagnosis not present

## 2020-11-07 DIAGNOSIS — E1169 Type 2 diabetes mellitus with other specified complication: Secondary | ICD-10-CM | POA: Diagnosis not present

## 2020-11-07 DIAGNOSIS — Z23 Encounter for immunization: Secondary | ICD-10-CM | POA: Diagnosis not present

## 2020-11-07 DIAGNOSIS — I10 Essential (primary) hypertension: Secondary | ICD-10-CM | POA: Diagnosis not present

## 2021-04-24 DIAGNOSIS — D509 Iron deficiency anemia, unspecified: Secondary | ICD-10-CM | POA: Diagnosis not present

## 2021-04-24 DIAGNOSIS — I10 Essential (primary) hypertension: Secondary | ICD-10-CM | POA: Diagnosis not present

## 2021-04-24 DIAGNOSIS — E1169 Type 2 diabetes mellitus with other specified complication: Secondary | ICD-10-CM | POA: Diagnosis not present

## 2021-04-24 DIAGNOSIS — E785 Hyperlipidemia, unspecified: Secondary | ICD-10-CM | POA: Diagnosis not present

## 2021-05-21 ENCOUNTER — Other Ambulatory Visit: Payer: Self-pay | Admitting: Family Medicine

## 2021-05-21 DIAGNOSIS — Z1231 Encounter for screening mammogram for malignant neoplasm of breast: Secondary | ICD-10-CM

## 2021-05-25 ENCOUNTER — Ambulatory Visit
Admission: RE | Admit: 2021-05-25 | Discharge: 2021-05-25 | Disposition: A | Discharge status: Home / Self Care | Payer: BC Managed Care – PPO | Source: Ambulatory Visit | Attending: Family Medicine | Admitting: Family Medicine

## 2021-05-25 DIAGNOSIS — Z1231 Encounter for screening mammogram for malignant neoplasm of breast: Secondary | ICD-10-CM

## 2021-12-14 DIAGNOSIS — Z0000 Encounter for general adult medical examination without abnormal findings: Secondary | ICD-10-CM | POA: Diagnosis not present

## 2021-12-14 DIAGNOSIS — I10 Essential (primary) hypertension: Secondary | ICD-10-CM | POA: Diagnosis not present

## 2021-12-14 DIAGNOSIS — E785 Hyperlipidemia, unspecified: Secondary | ICD-10-CM | POA: Diagnosis not present

## 2021-12-14 DIAGNOSIS — E1169 Type 2 diabetes mellitus with other specified complication: Secondary | ICD-10-CM | POA: Diagnosis not present

## 2022-02-10 DIAGNOSIS — H2513 Age-related nuclear cataract, bilateral: Secondary | ICD-10-CM | POA: Diagnosis not present

## 2022-02-10 DIAGNOSIS — H40033 Anatomical narrow angle, bilateral: Secondary | ICD-10-CM | POA: Diagnosis not present

## 2022-05-07 ENCOUNTER — Other Ambulatory Visit: Payer: Self-pay | Admitting: Family Medicine

## 2022-05-07 DIAGNOSIS — Z0000 Encounter for general adult medical examination without abnormal findings: Secondary | ICD-10-CM

## 2022-06-09 ENCOUNTER — Ambulatory Visit
Admission: RE | Admit: 2022-06-09 | Discharge: 2022-06-09 | Disposition: A | Discharge status: Home / Self Care | Payer: BC Managed Care – PPO | Source: Ambulatory Visit | Attending: Family Medicine | Admitting: Family Medicine

## 2022-06-09 DIAGNOSIS — Z1231 Encounter for screening mammogram for malignant neoplasm of breast: Secondary | ICD-10-CM | POA: Diagnosis not present

## 2022-06-09 DIAGNOSIS — Z0000 Encounter for general adult medical examination without abnormal findings: Secondary | ICD-10-CM

## 2022-06-11 ENCOUNTER — Other Ambulatory Visit: Payer: Self-pay | Admitting: Family Medicine

## 2022-06-11 DIAGNOSIS — R928 Other abnormal and inconclusive findings on diagnostic imaging of breast: Secondary | ICD-10-CM

## 2022-06-16 DIAGNOSIS — I10 Essential (primary) hypertension: Secondary | ICD-10-CM | POA: Diagnosis not present

## 2022-06-16 DIAGNOSIS — D509 Iron deficiency anemia, unspecified: Secondary | ICD-10-CM | POA: Diagnosis not present

## 2022-06-16 DIAGNOSIS — E785 Hyperlipidemia, unspecified: Secondary | ICD-10-CM | POA: Diagnosis not present

## 2022-06-16 DIAGNOSIS — E1169 Type 2 diabetes mellitus with other specified complication: Secondary | ICD-10-CM | POA: Diagnosis not present

## 2022-06-24 ENCOUNTER — Ambulatory Visit: Payer: BC Managed Care – PPO

## 2022-06-24 ENCOUNTER — Ambulatory Visit
Admission: RE | Admit: 2022-06-24 | Discharge: 2022-06-24 | Disposition: A | Discharge status: Home / Self Care | Payer: BC Managed Care – PPO | Source: Ambulatory Visit | Attending: Family Medicine | Admitting: Family Medicine

## 2022-06-24 DIAGNOSIS — R922 Inconclusive mammogram: Secondary | ICD-10-CM | POA: Diagnosis not present

## 2022-06-24 DIAGNOSIS — R928 Other abnormal and inconclusive findings on diagnostic imaging of breast: Secondary | ICD-10-CM

## 2022-09-02 IMAGING — MG MM DIGITAL SCREENING BILAT W/ TOMO AND CAD
8 series · 9 of 24 positions shown · non-contrast
Comparison: Previous exam(s).

CLINICAL DATA: Screening.

EXAM:
DIGITAL SCREENING BILATERAL MAMMOGRAM WITH TOMOSYNTHESIS AND CAD
TECHNIQUE: Bilateral screening digital craniocaudal and mediolateral oblique
mammograms were obtained. Bilateral screening digital breast
tomosynthesis was performed. The images were evaluated with
computer-aided detection.

[L CC synth-2D]
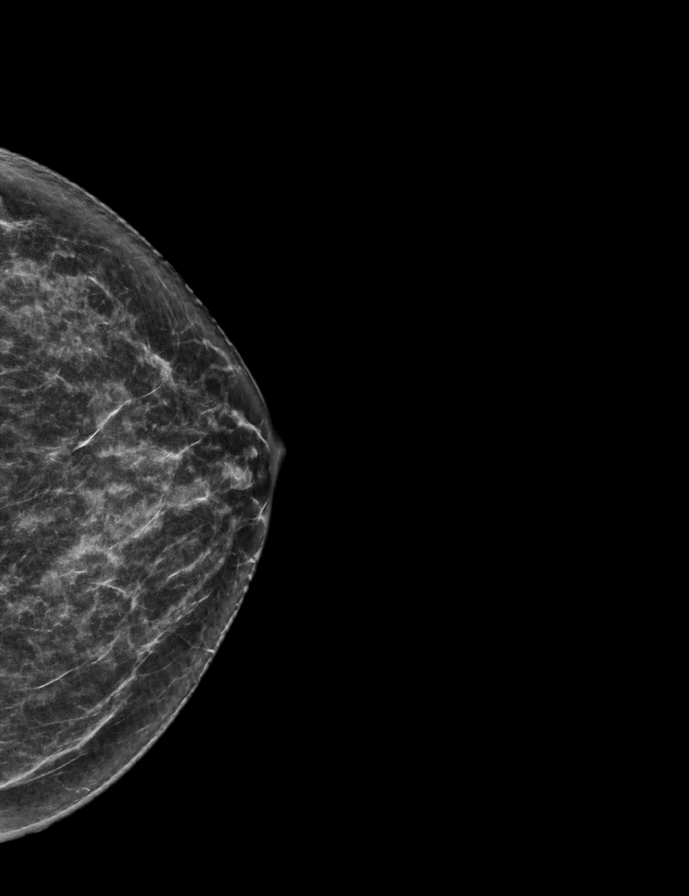

[L MLO synth-2D]
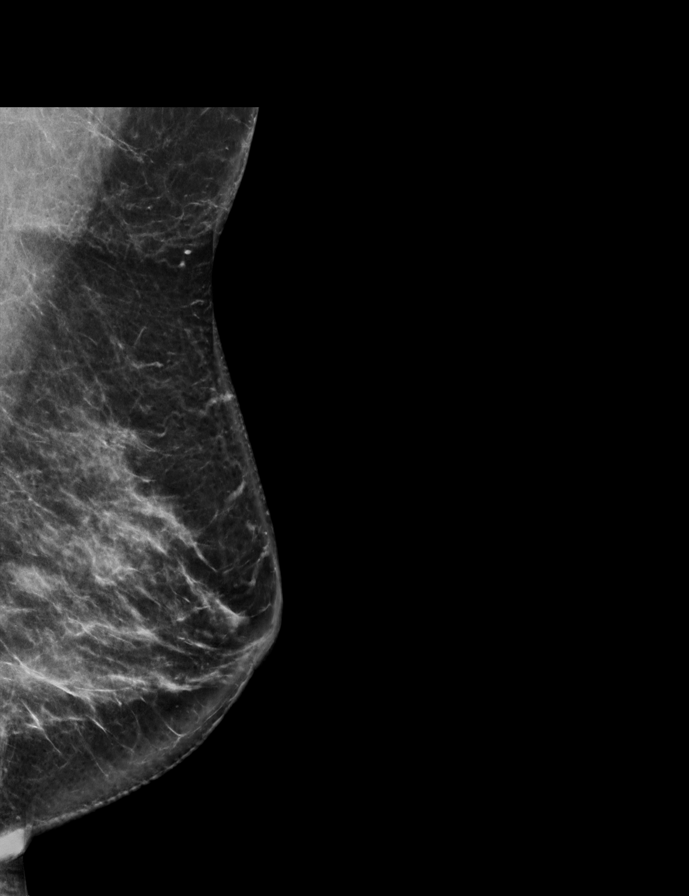

[R MLO synth-2D]
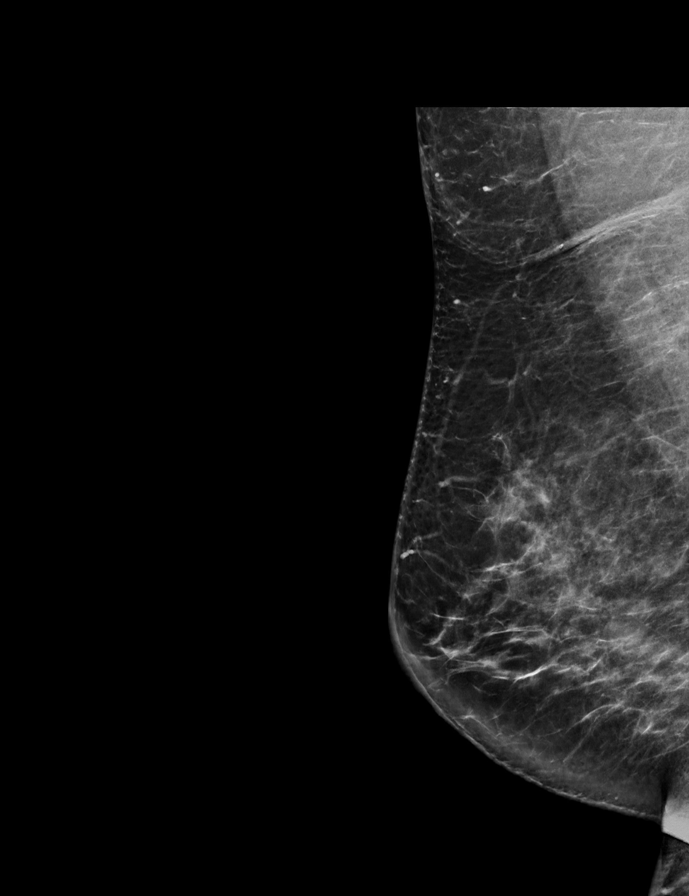

[R CC synth-2D]
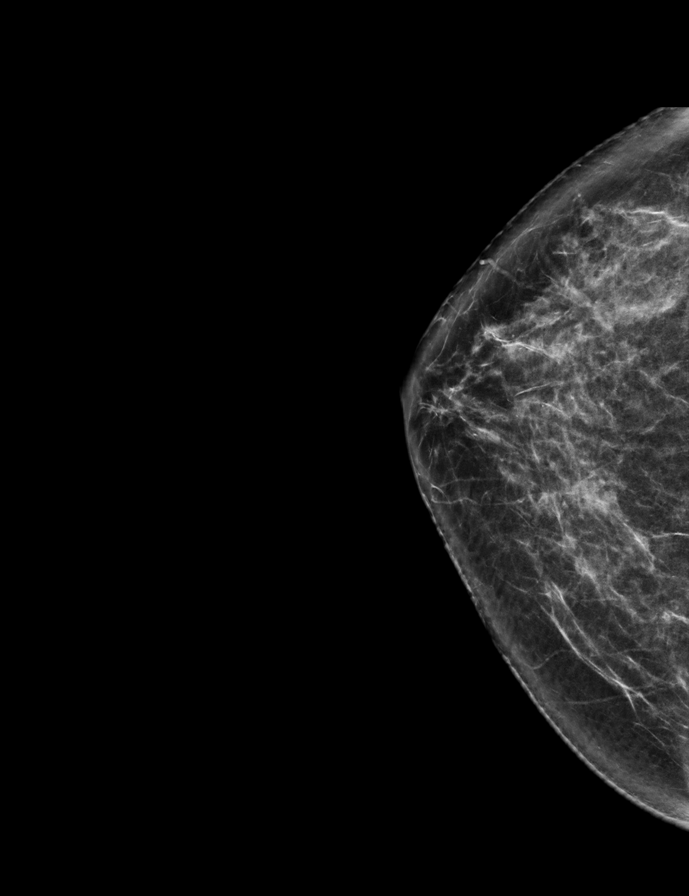

[L CC tomo · 2 of 58 frames shown]
[frame 19/58]
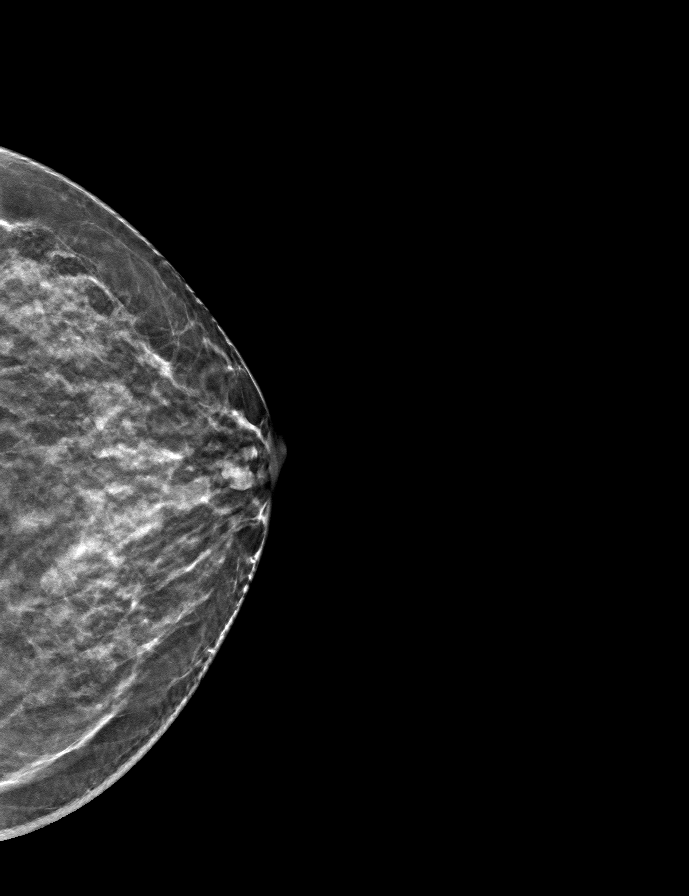
[frame 29/58]
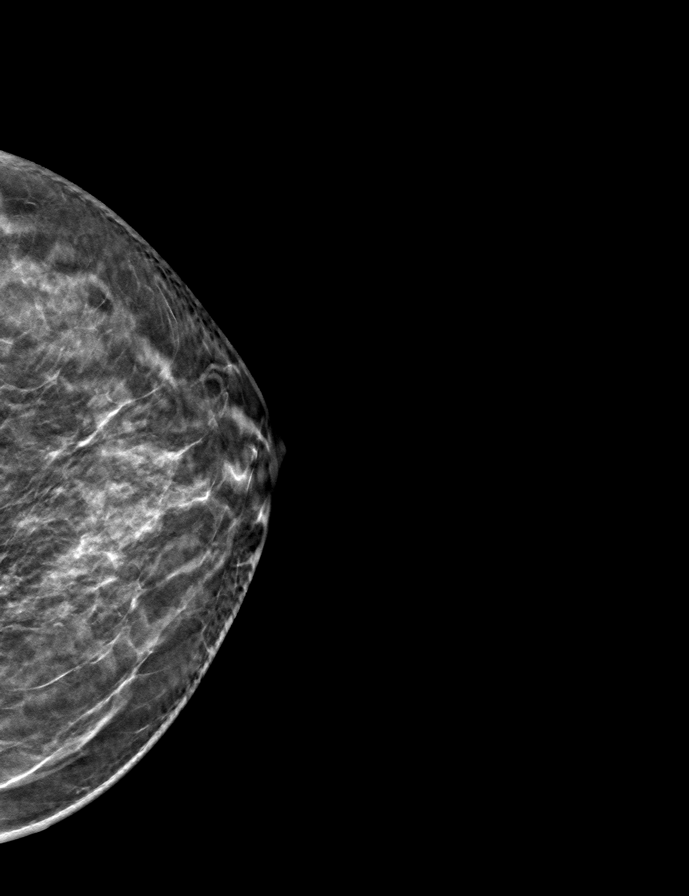

[R CC tomo · tomo slice 37/72.0]
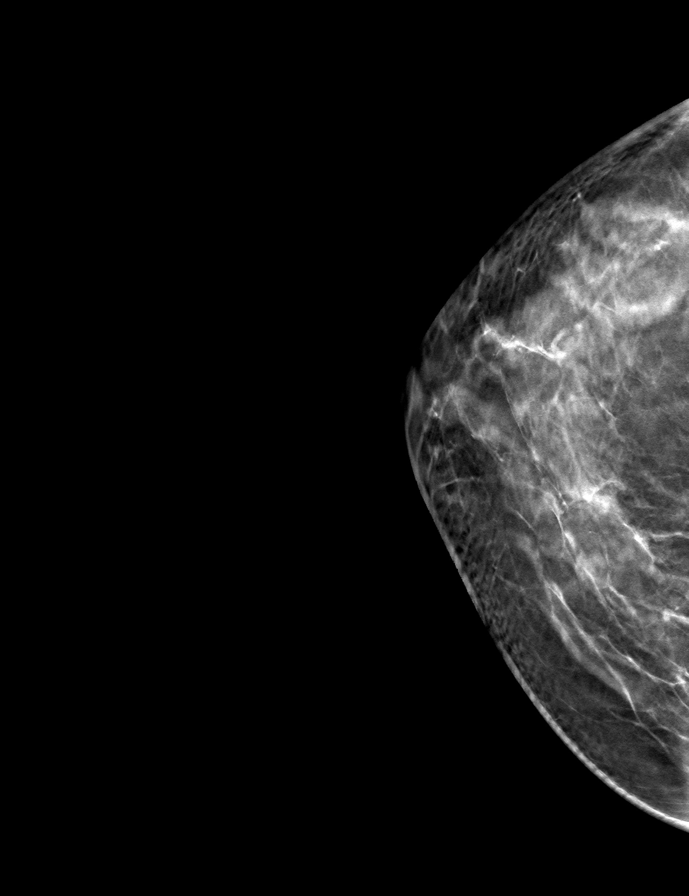

[R MLO tomo · tomo slice 37/74.0]
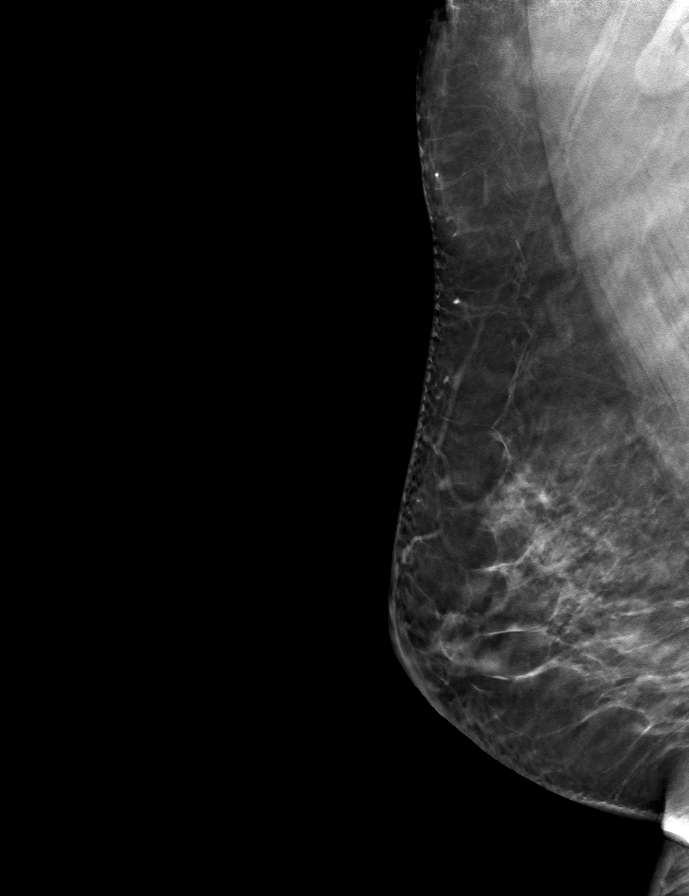

[L MLO tomo · tomo slice 39/78.0]
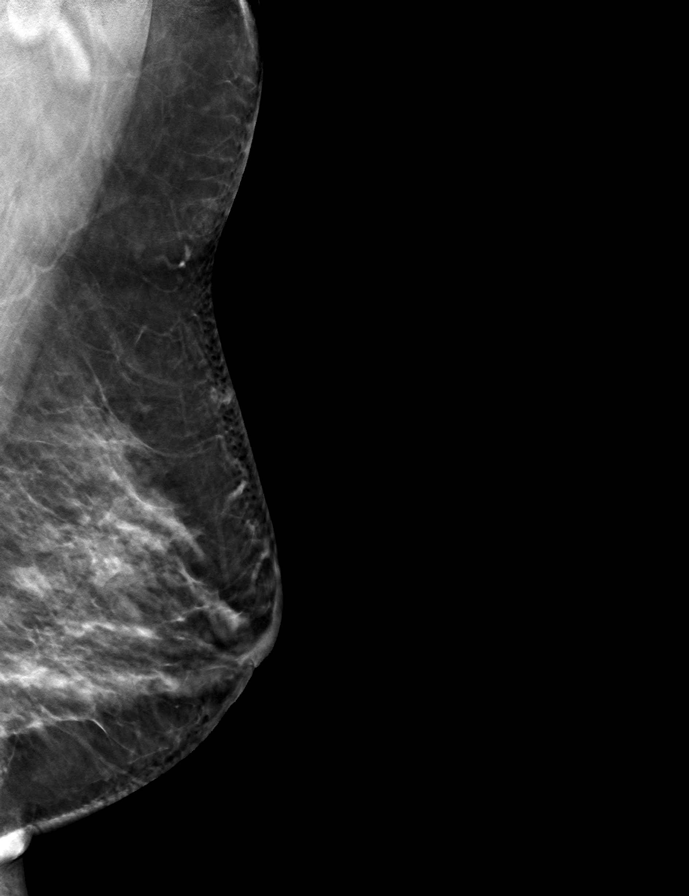

[9 of 24 positions shown; findings below may reference images not displayed]

ACR Breast Density Category c: The breast tissue is heterogeneously
dense, which may obscure small masses.
FINDINGS: There are no findings suspicious for malignancy. The images were
evaluated with computer-aided detection.
IMPRESSION: No mammographic evidence of malignancy. A result letter of this
screening mammogram will be mailed directly to the patient.

RECOMMENDATION:
Screening mammogram in one year. (Code:T4-5-GWO)

BI-RADS CATEGORY  1: Negative.

## 2023-01-18 DIAGNOSIS — D509 Iron deficiency anemia, unspecified: Secondary | ICD-10-CM | POA: Diagnosis not present

## 2023-01-18 DIAGNOSIS — E1169 Type 2 diabetes mellitus with other specified complication: Secondary | ICD-10-CM | POA: Diagnosis not present

## 2023-01-18 DIAGNOSIS — I10 Essential (primary) hypertension: Secondary | ICD-10-CM | POA: Diagnosis not present

## 2023-01-18 DIAGNOSIS — Z0000 Encounter for general adult medical examination without abnormal findings: Secondary | ICD-10-CM | POA: Diagnosis not present

## 2023-01-18 DIAGNOSIS — E785 Hyperlipidemia, unspecified: Secondary | ICD-10-CM | POA: Diagnosis not present

## 2023-02-06 ENCOUNTER — Emergency Department (HOSPITAL_BASED_OUTPATIENT_CLINIC_OR_DEPARTMENT_OTHER)
Admission: EM | Admit: 2023-02-06 | Discharge: 2023-02-06 | Disposition: A | Discharge status: Home / Self Care | Payer: BC Managed Care – PPO | Attending: Emergency Medicine | Admitting: Emergency Medicine

## 2023-02-06 ENCOUNTER — Encounter (HOSPITAL_BASED_OUTPATIENT_CLINIC_OR_DEPARTMENT_OTHER): Payer: Self-pay

## 2023-02-06 DIAGNOSIS — G500 Trigeminal neuralgia: Secondary | ICD-10-CM | POA: Insufficient documentation

## 2023-02-06 DIAGNOSIS — E119 Type 2 diabetes mellitus without complications: Secondary | ICD-10-CM | POA: Diagnosis not present

## 2023-02-06 DIAGNOSIS — I10 Essential (primary) hypertension: Secondary | ICD-10-CM | POA: Insufficient documentation

## 2023-02-06 DIAGNOSIS — R6884 Jaw pain: Secondary | ICD-10-CM | POA: Diagnosis not present

## 2023-02-06 MED ORDER — GABAPENTIN 300 MG PO CAPS: 300.0000 mg | ORAL_CAPSULE | Freq: Two times a day (BID) | ORAL | 0 refills | Status: AC

## 2023-02-06 NOTE — ED Provider Notes (Signed)
Emergency Department Provider Note   I have reviewed the triage vital signs and the nursing notes.   HISTORY  Chief Complaint Jaw Pain   HPI [name] [name] is a 58 y.o. female presents to the emergency department with intermittent left jaw pain radiating to her face and cheek.  Symptoms been ongoing for the past 2 months.  She is apparently been to see her dentist who performed x-rays and exam with no clear dental source of infection.  Pain is intermittent and burning/sharp.  Worse with opening the mouth but occasionally happens without clear provocation.   Past Medical History:  Diagnosis Date   Anemia    Diabetes mellitus without complication ([facility])    Hypercholesteremia    Hypertension     Review of Systems  Constitutional: No fever/chills ENT: Left jaw pain.  Cardiovascular: Denies chest pain. Respiratory: Denies shortness of breath. Gastrointestinal: No abdominal pain.  No nausea, no vomiting.   Musculoskeletal: Negative for back pain. Skin: Negative for rash. Neurological: Negative for headaches.  ____________________________________________   PHYSICAL EXAM:  VITAL SIGNS: ED Triage Vitals [02/06/23 0853]  Encounter Vitals Group     BP 100/76     Pulse Rate 68     Resp 16     Temp 98.3 F (36.8 C)     Temp Source Oral     SpO2 100 %   Constitutional: Alert and oriented. Well appearing and in no acute distress. Eyes: Conjunctivae are normal.  Head: Atraumatic. Nose: No congestion/rhinnorhea. Mouth/Throat: Mucous membranes are moist.  Oropharynx non-erythematous. No trismus.  Neck: No stridor.   Cardiovascular: Normal rate, regular rhythm. Good peripheral circulation. Grossly normal heart sounds.   Respiratory: Normal respiratory effort.  No retractions. Lungs CTAB. Gastrointestinal: No distention.  Musculoskeletal: No gross deformities of extremities. Neurologic:  Normal speech and language.  Skin:  Skin is warm, dry and intact. No rash  noted. ____________________________________________   PROCEDURES  Procedure(s) performed:   Procedures  None ____________________________________________   INITIAL IMPRESSION / ASSESSMENT AND PLAN / ED COURSE  Pertinent labs & imaging results that were available during my care of the patient were reviewed by me and considered in my medical decision making (see chart for details).   This patient is Presenting for Evaluation of jaw pain, which does require a range of treatment options, and is a complaint that involves a moderate risk of morbidity and mortality.  The Differential Diagnoses include TMJ, dental pain, trigeminal neuralgia, etc.  Medical Decision Making: Summary:  Patient presents the emergency department with intermittent shooting/burning pain into her left face and jaw.  No palpable clicking of the TMJ.  No face cellulitis, rash.  No hard signs to suspect deeper space neck or throat infection requiring emergent imaging.  Clinically, seems consistent with neuralgia type pain, possibly trigeminal neuralgia.  Plan to start the patient on gabapentin and referred to neurology and her PCP for further workup.  Patient's presentation is most consistent with acute, uncomplicated illness.   Disposition: discharge  ____________________________________________  FINAL CLINICAL IMPRESSION(S) / ED DIAGNOSES  Final diagnoses:  Trigeminal neuralgia     NEW OUTPATIENT MEDICATIONS STARTED DURING THIS VISIT:  Discharge Medication List as of 02/06/2023  9:21 AM     START taking these medications   Details  gabapentin (NEURONTIN) 300 MG capsule Take 1 capsule (300 mg total) by mouth 2 (two) times daily., Starting Sun 02/06/2023, Until Tue 03/08/2023, Normal        Note:  This document was prepared  using Conservation officer, historic buildings and may include unintentional dictation errors.  Alona Bene, MD, [facility] Emergency Medicine    Kaivon Livesey, Arlyss Repress, MD 02/09/23 [phone]

## 2023-02-06 NOTE — ED Triage Notes (Signed)
She c/o persistent left-sided jaw and facial pain radiating to left peri auricular area x 2 months. She has seen a dentist about this, who informed pt.she has "bad gums".

## 2023-02-06 NOTE — Discharge Instructions (Signed)
You were seen in the department today with jaw pain.  I think this is nerve pain coming from your trigeminal nerve.  I am starting on some nerve pain medication but would like you to follow both with your primary care doctor as well as a neurologist.  I have placed a referral in our system for neurology.  The medicine for pain may cause drowsiness.  He cannot drive while taking this medicine.  Do not take it with other strong pain medicines or with alcohol.

## 2023-02-15 ENCOUNTER — Encounter: Payer: Self-pay | Admitting: Neurology

## 2023-02-15 ENCOUNTER — Ambulatory Visit: Payer: Self-pay | Admitting: Neurology

## 2023-02-15 VITALS — BP 111/67 | HR 64 | Ht 62.0 in | Wt 168.0 lb

## 2023-02-15 DIAGNOSIS — G500 Trigeminal neuralgia: Secondary | ICD-10-CM | POA: Diagnosis not present

## 2023-02-15 MED ORDER — GABAPENTIN 300 MG PO CAPS: 600.0000 mg | ORAL_CAPSULE | Freq: Three times a day (TID) | ORAL | 11 refills | Status: AC

## 2023-02-15 NOTE — Progress Notes (Signed)
Chief Complaint  Patient presents with   Trigeminal Neuralgia    Rm14, son present, declined interpreter (waiver signed), NP Urgent internal referral for trigeminal neuralgia: gums and left side of face is very painful (ongoing since 2023)      ASSESSMENT AND PLAN  [name] [name] is a 58 y.o. female   Left trigeminal neuralgia  Involving left V3, V2 distribution  MRI of the brain with without contrast to rule out left trigeminal ganglion structural abnormality,  Gabapentin higher dose up to   600 mg 3 times a day  Return To Clinic With NP In 6 Months   DIAGNOSTIC DATA (LABS, IMAGING, TESTING) - I reviewed patient records, labs, notes, testing and imaging myself where available.   MEDICAL HISTORY:  [name] [name] is a 58 year old female accompanied by her son, seen in request by her primary care from [facility], Dr. Wynelle Link, Charise Carwin, for evaluation of left facial pain, initial evaluation February 15, 2023  History is obtained from the patient and review of electronic medical records. I personally reviewed pertinent available imaging films in PACS.   PMHx of  HLD DM HTN  She immigrated from [place] 40 years ago, works at [employer] job, since 2024, she has intermittent shooting pain to left lower, sharp electric shock, getting worse since January 2025, also began to involving left upper tooth, was seen by dentist, had left upper tooth root canal without solving her problem, significant pain present 75% of her daytime, difficulty sleeping, eating, report 20 pound weight loss over the past few weeks, facial pain with triggered by talking, eating,  She was put on gabapentin 300 mg twice daily since emergency room visit on February 06, 2023, which did help her symptoms, she can release to sleep, but the benefit only last 3 to 4 hours    PHYSICAL EXAM:   Vitals:   02/15/23 1059  BP: 111/67  Pulse: 64  Weight: 168 lb (76.2 kg)  Height: 5\' 2"  (1.575 m)   Not recorded     Body mass index  is 30.73 kg/m.  PHYSICAL EXAMNIATION:  Gen: NAD, conversant, well nourised, well groomed                     Cardiovascular: Regular rate rhythm, no peripheral edema, warm, nontender. Eyes: Conjunctivae clear without exudates or hemorrhage Neck: Supple, no carotid bruits. Pulmonary: Clear to auscultation bilaterally   NEUROLOGICAL EXAM:  MENTAL STATUS: Speech/cognition: Awake, alert, oriented to history taking and casual conversation CRANIAL NERVES: CN II: Visual fields are full to confrontation. Pupils are round equal and briskly reactive to light. CN III, IV, VI: extraocular movement are normal. No ptosis. CN V: Facial sensation is intact to light touch, bilateral corneal reflexes were normal and symmetric CN VII: Face is symmetric with normal eye closure  CN VIII: Hearing is normal to causal conversation. CN IX, X: Phonation is normal. CN XI: Head turning and shoulder shrug are intact  MOTOR: There is no pronator drift of out-stretched arms. Muscle bulk and tone are normal. Muscle strength is normal.  REFLEXES: Reflexes are 2+ and symmetric at the biceps, triceps, knees, and ankles. Plantar responses are flexor.  SENSORY: Intact to light touch, pinprick and vibratory sensation are intact in fingers and toes.  COORDINATION: There is no trunk or limb dysmetria noted.  GAIT/STANCE: Posture is normal. Gait is steady with normal steps, base, arm swing, and turning. Heel and toe walking are normal. Tandem gait is normal.  Romberg is  absent.  REVIEW OF SYSTEMS:  Full 14 system review of systems performed and notable only for as above All other review of systems were negative.   ALLERGIES: No Known Allergies  HOME MEDICATIONS: Current Outpatient Medications  Medication Sig Dispense Refill   gabapentin (NEURONTIN) 300 MG capsule Take 1 capsule (300 mg total) by mouth 2 (two) times daily. 60 capsule 0   ibuprofen (ADVIL,MOTRIN) 800 MG tablet Take 1 tablet (800 mg total)  by mouth 3 (three) times daily. 21 tablet 0   ondansetron (ZOFRAN ODT) 4 MG disintegrating tablet 4mg  ODT q4 hours prn nausea/vomit 15 tablet 0   ondansetron (ZOFRAN) 4 MG tablet Take 1 tablet (4 mg total) by mouth every 6 (six) hours. 12 tablet 0   oxyCODONE-acetaminophen (PERCOCET) 5-325 MG per tablet Take 1-2 tablets by mouth every 6 (six) hours as needed for moderate pain. 20 tablet 0   rosuvastatin (CRESTOR) 20 MG tablet Take 20 mg by mouth daily.     tamsulosin (FLOMAX) 0.4 MG CAPS capsule Take 1 capsule (0.4 mg total) by mouth daily. 15 capsule 0   No current facility-administered medications for this visit.    PAST MEDICAL HISTORY: Past Medical History:  Diagnosis Date   Anemia    Diabetes mellitus without complication (HCC)    Hypercholesteremia    Hypertension     PAST SURGICAL HISTORY: Past Surgical History:  Procedure Laterality Date   CESAREAN SECTION     2 times     FAMILY HISTORY: Family History  Problem Relation Age of Onset   Diabetes Mother    Asthma Son    Cancer Brother 32       blood cancer    SOCIAL HISTORY: Social History   Socioeconomic History   Marital status: Married    Spouse name: Not on file   Number of children: Not on file   Years of education: Not on file   Highest education level: Not on file  Occupational History   Not on file  Tobacco Use   Smoking status: Never   Smokeless tobacco: Never  Substance and Sexual Activity   Alcohol use: No   Drug use: No   Sexual activity: Never    Birth control/protection: None  Other Topics Concern   Not on file  Social History Narrative   Not on file   Social Drivers of Health   Financial Resource Strain: Not on file  Food Insecurity: Not on file  Transportation Needs: Not on file  Physical Activity: Not on file  Stress: Not on file  Social Connections: Unknown (05/15/2021)   [facility], [facility]   Social Network    Social Network: Not on file  Intimate  Partner Violence: Unknown (04/06/2021)   Received from [facility], [facility]   HITS    Physically Hurt: Not on file    Insult or Talk Down To: Not on file    Threaten Physical Harm: Not on file    Scream or Curse: Not on file      Levert Feinstein, M.D. Ph.D.  [facility] [address], [address] [place], [place] [address] Ph: [phone] Fax: [phone]  CC:  Maia Plan, MD [address] [place],  [place] [address]  Deatra James, MD

## 2023-02-16 ENCOUNTER — Encounter: Payer: Self-pay | Admitting: Neurology

## 2023-02-16 ENCOUNTER — Telehealth: Payer: Self-pay | Admitting: Neurology

## 2023-02-16 DIAGNOSIS — H40033 Anatomical narrow angle, bilateral: Secondary | ICD-10-CM | POA: Diagnosis not present

## 2023-02-16 DIAGNOSIS — H2513 Age-related nuclear cataract, bilateral: Secondary | ICD-10-CM | POA: Diagnosis not present

## 2023-02-16 LAB — CBC WITH DIFFERENTIAL/PLATELET
Basophils Absolute: 0.1 10*3/uL (ref 0.0–0.2)
Basos: 1 %
EOS (ABSOLUTE): 0.4 10*3/uL (ref 0.0–0.4)
Eos: 7 %
Hematocrit: 44 % (ref 34.0–46.6)
Hemoglobin: 14.5 g/dL (ref 11.1–15.9)
Immature Grans (Abs): 0 10*3/uL (ref 0.0–0.1)
Immature Granulocytes: 0 %
Lymphocytes Absolute: 2.3 10*3/uL (ref 0.7–3.1)
Lymphs: 36 %
MCH: 29.9 pg (ref 26.6–33.0)
MCHC: 33 g/dL (ref 31.5–35.7)
MCV: 91 fL (ref 79–97)
Monocytes Absolute: 0.4 10*3/uL (ref 0.1–0.9)
Monocytes: 6 %
Neutrophils Absolute: 3.1 10*3/uL (ref 1.4–7.0)
Neutrophils: 50 %
Platelets: 103 10*3/uL — ABNORMAL LOW (ref 150–450)
RBC: 4.85 x10E6/uL (ref 3.77–5.28)
RDW: 13.6 % (ref 11.7–15.4)
WBC: 6.3 10*3/uL (ref 3.4–10.8)

## 2023-02-16 LAB — COMPREHENSIVE METABOLIC PANEL
ALT: 35 [IU]/L — ABNORMAL HIGH (ref 0–32)
AST: 26 [IU]/L (ref 0–40)
Albumin: 4.2 g/dL (ref 3.8–4.9)
Alkaline Phosphatase: 56 [IU]/L (ref 44–121)
BUN/Creatinine Ratio: 16 (ref 9–23)
BUN: 18 mg/dL (ref 6–24)
Bilirubin Total: 1 mg/dL (ref 0.0–1.2)
CO2: 27 mmol/L (ref 20–29)
Calcium: 9.5 mg/dL (ref 8.7–10.2)
Chloride: 103 mmol/L (ref 96–106)
Creatinine, Ser: 1.15 mg/dL — ABNORMAL HIGH (ref 0.57–1.00)
Globulin, Total: 2.7 g/dL (ref 1.5–4.5)
Glucose: 100 mg/dL — ABNORMAL HIGH (ref 70–99)
Potassium: 4.1 mmol/L (ref 3.5–5.2)
Sodium: 142 mmol/L (ref 134–144)
Total Protein: 6.9 g/dL (ref 6.0–8.5)
eGFR: 56 mL/min/{1.73_m2} — ABNORMAL LOW (ref >59)

## 2023-02-16 LAB — TSH: TSH: 1.58 u[IU]/mL (ref 0.450–4.500)

## 2023-02-16 NOTE — Telephone Encounter (Signed)
[place] Numbers: [id number] exp. 02/16/23-03/17/23 sent to GI [phone]

## 2023-02-28 ENCOUNTER — Encounter: Payer: Self-pay | Admitting: Neurology

## 2023-03-18 ENCOUNTER — Encounter: Payer: Self-pay | Admitting: Neurology

## 2023-03-27 ENCOUNTER — Ambulatory Visit
Admission: RE | Admit: 2023-03-27 | Discharge: 2023-03-27 | Disposition: A | Discharge status: Home / Self Care | Payer: BC Managed Care – PPO | Source: Ambulatory Visit | Attending: Neurology | Admitting: Neurology

## 2023-03-27 DIAGNOSIS — G500 Trigeminal neuralgia: Secondary | ICD-10-CM

## 2023-03-27 MED ORDER — GADOPICLENOL 0.5 MMOL/ML IV SOLN
8.0000 mL | Freq: Once | INTRAVENOUS | Status: AC | PRN
  Administered 2023-03-27: 8 mL via INTRAVENOUS

## 2023-03-28 ENCOUNTER — Telehealth: Payer: Self-pay | Admitting: Neurology

## 2023-03-28 DIAGNOSIS — G500 Trigeminal neuralgia: Secondary | ICD-10-CM

## 2023-03-28 NOTE — Telephone Encounter (Signed)
 ID [id number] used for interpretation No answer, left message to call office for results

## 2023-03-28 NOTE — Telephone Encounter (Signed)
 Please call patient, MRI of the brain showed left prepontine 11 x 9 x 12 mm mass, consistent with meningioma, which displaced the left trigeminal nerve towards the left, this can potentially explain her left facial pain, I have put in referral to [facility] neurosurgeon Dr.    Dr. Jeannett Senior B. Angelyn Punt, MD, PhD  Address: [address] [facility] [address] [place], [place] [address] Phone: [phone]  Please also check her pain control with current medications   IMPRESSION: This MRI of the brain with and without contrast shows the following: 11 x 9 x 12 mm homogenously enhancing dural based mass in the left prepontine region with some involvement of Meckel's cave.  The cisternal segment of the left trigeminal nerve is displaced towards the left.  It is most consistent with a meningioma. The brain was otherwise normal for age.  No acute findings.

## 2023-03-29 ENCOUNTER — Telehealth: Payer: Self-pay | Admitting: Neurology

## 2023-03-29 NOTE — Telephone Encounter (Signed)
 Call to sister Sopel, she did speak english but used interpreter [phone] in case. Gave results of MRI and Dr. Angelyn Punt contact info at [facility]. Sister verbalized understanding and appreciative of call

## 2023-03-29 NOTE — Telephone Encounter (Signed)
 Referral for neurosurgery fax to [facility]. Phone: [phone], Fax: [phone]

## 2023-05-09 DIAGNOSIS — G500 Trigeminal neuralgia: Secondary | ICD-10-CM | POA: Diagnosis not present

## 2023-05-26 ENCOUNTER — Encounter: Payer: Self-pay | Admitting: Family Medicine

## 2023-05-26 ENCOUNTER — Telehealth: Payer: Self-pay | Admitting: Family Medicine

## 2023-05-26 NOTE — Telephone Encounter (Signed)
 Sent text msg and letter in mail informing pt of need to reschedule 08/22/23 appt - NP out

## 2023-06-02 DIAGNOSIS — D333 Benign neoplasm of cranial nerves: Secondary | ICD-10-CM | POA: Diagnosis not present

## 2023-06-02 DIAGNOSIS — G500 Trigeminal neuralgia: Secondary | ICD-10-CM | POA: Diagnosis not present

## 2023-06-02 DIAGNOSIS — D420 Neoplasm of uncertain behavior of cerebral meninges: Secondary | ICD-10-CM | POA: Diagnosis not present

## 2023-06-02 DIAGNOSIS — Z510 Encounter for antineoplastic radiation therapy: Secondary | ICD-10-CM | POA: Diagnosis not present

## 2023-07-18 DIAGNOSIS — E1169 Type 2 diabetes mellitus with other specified complication: Secondary | ICD-10-CM | POA: Diagnosis not present

## 2023-07-18 DIAGNOSIS — E785 Hyperlipidemia, unspecified: Secondary | ICD-10-CM | POA: Diagnosis not present

## 2023-07-18 DIAGNOSIS — I10 Essential (primary) hypertension: Secondary | ICD-10-CM | POA: Diagnosis not present

## 2023-07-18 DIAGNOSIS — D509 Iron deficiency anemia, unspecified: Secondary | ICD-10-CM | POA: Diagnosis not present

## 2023-08-22 ENCOUNTER — Ambulatory Visit: Payer: BC Managed Care – PPO | Admitting: Family Medicine

## 2023-11-03 DIAGNOSIS — G500 Trigeminal neuralgia: Secondary | ICD-10-CM | POA: Diagnosis not present
